# Patient Record
Sex: Male | Born: 1945 | Race: White | Hispanic: No | Marital: Married | Smoking: Never smoker
Health system: Southern US, Community
[De-identification: ages and names within clinical notes are randomized; demographics above are authoritative.]

## PROBLEM LIST (undated history)

## (undated) DIAGNOSIS — Z789 Other specified health status: Secondary | ICD-10-CM

## (undated) HISTORY — PX: HERNIA REPAIR: SHX51

## (undated) HISTORY — PX: APPENDECTOMY: SHX54

## (undated) HISTORY — PX: BACK SURGERY: SHX140

## (undated) HISTORY — PX: KNEE ARTHROSCOPY: SUR90

## (undated) HISTORY — PX: OTHER SURGICAL HISTORY: SHX169

---

## 2013-05-10 ENCOUNTER — Encounter (INDEPENDENT_AMBULATORY_CARE_PROVIDER_SITE_OTHER): Payer: Self-pay | Admitting: *Deleted

## 2013-08-01 ENCOUNTER — Encounter (INDEPENDENT_AMBULATORY_CARE_PROVIDER_SITE_OTHER): Payer: Self-pay | Admitting: *Deleted

## 2013-09-12 ENCOUNTER — Telehealth (INDEPENDENT_AMBULATORY_CARE_PROVIDER_SITE_OTHER): Payer: Self-pay | Admitting: *Deleted

## 2013-09-12 ENCOUNTER — Encounter (INDEPENDENT_AMBULATORY_CARE_PROVIDER_SITE_OTHER): Payer: Self-pay | Admitting: *Deleted

## 2013-09-12 NOTE — Telephone Encounter (Signed)
Letter mailed to patient.

## 2013-09-12 NOTE — Telephone Encounter (Signed)
Inetta Fermo from [facility] called to check on patient's referral. They have a different address and it was corrected. Would like to see if you would please send out another letter to patient to get his TCS scheduled.

## 2013-09-19 ENCOUNTER — Telehealth (INDEPENDENT_AMBULATORY_CARE_PROVIDER_SITE_OTHER): Payer: Self-pay | Admitting: *Deleted

## 2013-09-19 ENCOUNTER — Other Ambulatory Visit (INDEPENDENT_AMBULATORY_CARE_PROVIDER_SITE_OTHER): Payer: Self-pay | Admitting: *Deleted

## 2013-09-19 ENCOUNTER — Encounter (INDEPENDENT_AMBULATORY_CARE_PROVIDER_SITE_OTHER): Payer: Self-pay | Admitting: *Deleted

## 2013-09-19 DIAGNOSIS — Z1211 Encounter for screening for malignant neoplasm of colon: Secondary | ICD-10-CM

## 2013-09-19 NOTE — Telephone Encounter (Signed)
Patient needs movi prep 

## 2013-09-21 MED ORDER — PEG-KCL-NACL-NASULF-NA ASC-C 100 G PO SOLR: 1.0000 | Freq: Once | ORAL | Status: DC

## 2013-11-13 ENCOUNTER — Telehealth (INDEPENDENT_AMBULATORY_CARE_PROVIDER_SITE_OTHER): Payer: Self-pay | Admitting: *Deleted

## 2013-11-13 NOTE — Telephone Encounter (Signed)
agree

## 2013-11-13 NOTE — Telephone Encounter (Signed)
  Procedure: tcs  Reason/Indication:  screening  Has patient had this procedure before?  Yes, 13-14 yrs ago  If so, when, by whom and where?    Is there a family history of colon cancer?  no  Who?  What age when diagnosed?    Is patient diabetic?   no      Does patient have prosthetic heart valve?  no  Do you have a pacemaker?  no  Has patient ever had endocarditis? no  Has patient had joint replacement within last 12 months?  no  Does patient tend to be constipated or take laxatives? no  Is patient on Coumadin, Plavix and/or Aspirin? no  Medications: none  Allergies: nkda  Medication Adjustment:   Procedure date & time: 12/06/13 at 830

## 2013-11-27 ENCOUNTER — Encounter (HOSPITAL_COMMUNITY): Payer: Self-pay | Admitting: Pharmacy Technician

## 2013-12-06 ENCOUNTER — Encounter (HOSPITAL_COMMUNITY)
Admission: RE | Disposition: A | Discharge status: Home / Self Care | Payer: Self-pay | Source: Ambulatory Visit | Attending: Internal Medicine

## 2013-12-06 ENCOUNTER — Ambulatory Visit (HOSPITAL_COMMUNITY)
Admission: RE | Admit: 2013-12-06 | Discharge: 2013-12-06 | Disposition: A | Discharge status: Home / Self Care | Payer: Medicare Other | Source: Ambulatory Visit | Attending: Internal Medicine | Admitting: Internal Medicine

## 2013-12-06 ENCOUNTER — Encounter (HOSPITAL_COMMUNITY): Payer: Self-pay | Admitting: *Deleted

## 2013-12-06 DIAGNOSIS — D126 Benign neoplasm of colon, unspecified: Secondary | ICD-10-CM | POA: Insufficient documentation

## 2013-12-06 DIAGNOSIS — K5730 Diverticulosis of large intestine without perforation or abscess without bleeding: Secondary | ICD-10-CM | POA: Insufficient documentation

## 2013-12-06 DIAGNOSIS — Z1211 Encounter for screening for malignant neoplasm of colon: Secondary | ICD-10-CM | POA: Insufficient documentation

## 2013-12-06 HISTORY — PX: COLONOSCOPY: SHX5424

## 2013-12-06 HISTORY — DX: Other specified health status: Z78.9

## 2013-12-06 SURGERY — COLONOSCOPY
Anesthesia: Moderate Sedation

## 2013-12-06 MED ORDER — SODIUM CHLORIDE 0.9 % IV SOLN
INTRAVENOUS | Status: DC
Start: 2013-12-06 — End: 2013-12-06
  Administered 2013-12-06: 08:00:00 via INTRAVENOUS

## 2013-12-06 MED ORDER — MIDAZOLAM HCL 5 MG/5ML IJ SOLN
INTRAMUSCULAR | Status: DC | PRN
  Administered 2013-12-06: 2 mg via INTRAVENOUS
  Administered 2013-12-06 (×2): 1 mg via INTRAVENOUS
  Administered 2013-12-06: 2 mg via INTRAVENOUS

## 2013-12-06 MED ORDER — MEPERIDINE HCL 50 MG/ML IJ SOLN
INTRAMUSCULAR | Status: DC | PRN
  Administered 2013-12-06 (×2): 25 mg via INTRAVENOUS

## 2013-12-06 MED ORDER — MIDAZOLAM HCL 5 MG/5ML IJ SOLN
INTRAMUSCULAR | Status: AC
  Filled 2013-12-06: qty 10

## 2013-12-06 MED ORDER — STERILE WATER FOR IRRIGATION IR SOLN
Status: DC | PRN
  Administered 2013-12-06: 09:00:00

## 2013-12-06 MED ORDER — MEPERIDINE HCL 50 MG/ML IJ SOLN
INTRAMUSCULAR | Status: DC
Start: 2013-12-06 — End: 2013-12-06
  Filled 2013-12-06: qty 1

## 2013-12-06 NOTE — Discharge Instructions (Addendum)
No aspirin or NSAIDs for 1 week. High fiber diet. No driving for 24 hours. Physician will contact you with biopsy results.  Colonoscopy Care After Read the instructions outlined below and refer to this sheet in the next few weeks. These discharge instructions provide you with general information on caring for yourself after you leave the hospital. Your doctor may also give you specific instructions. While your treatment has been planned according to the most current medical practices available, unavoidable complications occasionally occur. If you have any problems or questions after discharge, call your doctor. HOME CARE INSTRUCTIONS ACTIVITY:  You may resume your regular activity, but move at a slower pace for the next 24 hours.  Take frequent rest periods for the next 24 hours.  Walking will help get rid of the air and reduce the bloated feeling in your belly (abdomen).  No driving for 24 hours (because of the medicine (anesthesia) used during the test).  You may shower.  Do not sign any important legal documents or operate any machinery for 24 hours (because of the anesthesia used during the test). NUTRITION:  Drink plenty of fluids.  You may resume your normal diet as instructed by your doctor.  Begin with a light meal and progress to your normal diet. Heavy or fried foods are harder to digest and may make you feel sick to your stomach (nauseated).  Avoid alcoholic beverages for 24 hours or as instructed. MEDICATIONS:  You may resume your normal medications unless your doctor tells you otherwise. WHAT TO EXPECT TODAY:  Some feelings of bloating in the abdomen.  Passage of more gas than usual.  Spotting of blood in your stool or on the toilet paper. IF YOU HAD POLYPS REMOVED DURING THE COLONOSCOPY:  No aspirin products for 7 days or as instructed.  No alcohol for 7 days or as instructed.  Eat a soft diet for the next 24 hours. FINDING OUT THE RESULTS OF YOUR  TEST Not all test results are available during your visit. If your test results are not back during the visit, make an appointment with your caregiver to find out the results. Do not assume everything is normal if you have not heard from your caregiver or the medical facility. It is important for you to follow up on all of your test results.  SEEK IMMEDIATE MEDICAL CARE IF:  You have more than a spotting of blood in your stool.  Your belly is swollen (abdominal distention).  You are nauseated or vomiting.  You have a fever.  You have abdominal pain or discomfort that is severe or gets worse throughout the day. Document Released: 06/29/2004 Document Revised: 02/07/2012 Document Reviewed: 06/27/2008 [facility] Patient Information 2014 [place]. Colon Polyps Polyps are lumps of extra tissue growing inside the body. Polyps can grow in the large intestine (colon). Most colon polyps are noncancerous (benign). However, some colon polyps can become cancerous over time. Polyps that are larger than a pea may be harmful. To be safe, caregivers remove and test all polyps. CAUSES  Polyps form when mutations in the genes cause your cells to grow and divide even though no more tissue is needed. RISK FACTORS There are a number of risk factors that can increase your chances of getting colon polyps. They include:  Being older than 50 years.  Family history of colon polyps or colon cancer.  Long-term colon diseases, such as colitis or Crohn disease.  Being overweight.  Smoking.  Being inactive.  Drinking too much alcohol.  SYMPTOMS  Most small polyps do not cause symptoms. If symptoms are present, they may include:  Blood in the stool. The stool may look dark red or black.  Constipation or diarrhea that lasts longer than 1 week. DIAGNOSIS People often do not know they have polyps until their caregiver finds them during a regular checkup. Your caregiver can use 4 tests to check for  polyps:  Digital rectal exam. The caregiver wears gloves and feels inside the rectum. This test would find polyps only in the rectum.  Barium enema. The caregiver puts a liquid called barium into your rectum before taking X-rays of your colon. Barium makes your colon look white. Polyps are dark, so they are easy to see in the X-ray pictures.  Sigmoidoscopy. A thin, flexible tube (sigmoidoscope) is placed into your rectum. The sigmoidoscope has a light and tiny camera in it. The caregiver uses the sigmoidoscope to look at the last third of your colon.  Colonoscopy. This test is like sigmoidoscopy, but the caregiver looks at the entire colon. This is the most common method for finding and removing polyps. TREATMENT  Any polyps will be removed during a sigmoidoscopy or colonoscopy. The polyps are then tested for cancer. PREVENTION  To help lower your risk of getting more colon polyps:  Eat plenty of fruits and vegetables. Avoid eating fatty foods.  Do not smoke.  Avoid drinking alcohol.  Exercise every day.  Lose weight if recommended by your caregiver.  Eat plenty of calcium and folate. Foods that are rich in calcium include milk, cheese, and broccoli. Foods that are rich in folate include chickpeas, kidney beans, and spinach. HOME CARE INSTRUCTIONS Keep all follow-up appointments as directed by your caregiver. You may need periodic exams to check for polyps. SEEK MEDICAL CARE IF: You notice bleeding during a bowel movement. Document Released: 08/11/2004 Document Revised: 02/07/2012 Document Reviewed: 01/25/2012 [facility] Patient Information 2014 [place]. High-Fiber Diet Fiber is found in fruits, vegetables, and grains. A high-fiber diet encourages the addition of more whole grains, legumes, fruits, and vegetables in your diet. The recommended amount of fiber for adult males is 38 g per day. For adult females, it is 25 g per day. Pregnant and lactating women should get 28 g of  fiber per day. If you have a digestive or bowel problem, ask your caregiver for advice before adding high-fiber foods to your diet. Eat a variety of high-fiber foods instead of only a select few type of foods.  PURPOSE  To increase stool bulk.  To make bowel movements more regular to prevent constipation.  To lower cholesterol.  To prevent overeating. WHEN IS THIS DIET USED?  It may be used if you have constipation and hemorrhoids.  It may be used if you have uncomplicated diverticulosis (intestine condition) and irritable bowel syndrome.  It may be used if you need help with weight management.  It may be used if you want to add it to your diet as a protective measure against atherosclerosis, diabetes, and cancer. SOURCES OF FIBER  Whole-grain breads and cereals.  Fruits, such as apples, oranges, bananas, berries, prunes, and pears.  Vegetables, such as green peas, carrots, sweet potatoes, beets, broccoli, cabbage, spinach, and artichokes.  Legumes, such split peas, soy, lentils.  Almonds. FIBER CONTENT IN FOODS Starches and Grains / Dietary Fiber (g)  Cheerios, 1 cup / 3 g  Corn Flakes cereal, 1 cup / 0.7 g  Rice crispy treat cereal, 1 cup / 0.3 g  Instant  oatmeal (cooked),  cup / 2 g  Frosted wheat cereal, 1 cup / 5.1 g  Brown, long-grain rice (cooked), 1 cup / 3.5 g  White, long-grain rice (cooked), 1 cup / 0.6 g  Enriched macaroni (cooked), 1 cup / 2.5 g Legumes / Dietary Fiber (g)  Baked beans (canned, plain, or vegetarian),  cup / 5.2 g  Kidney beans (canned),  cup / 6.8 g  Pinto beans (cooked),  cup / 5.5 g Breads and Crackers / Dietary Fiber (g)  Plain or honey graham crackers, 2 squares / 0.7 g  Saltine crackers, 3 squares / 0.3 g  Plain, salted pretzels, 10 pieces / 1.8 g  Whole-wheat bread, 1 slice / 1.9 g  White bread, 1 slice / 0.7 g  Raisin bread, 1 slice / 1.2 g  Plain bagel, 3 oz / 2 g  Flour tortilla, 1 oz / 0.9 g  Corn  tortilla, 1 small / 1.5 g  Hamburger or hotdog bun, 1 small / 0.9 g Fruits / Dietary Fiber (g)  Apple with skin, 1 medium / 4.4 g  Sweetened applesauce,  cup / 1.5 g  Banana,  medium / 1.5 g  Grapes, 10 grapes / 0.4 g  Orange, 1 small / 2.3 g  Raisin, 1.5 oz / 1.6 g  Melon, 1 cup / 1.4 g Vegetables / Dietary Fiber (g)  Green beans (canned),  cup / 1.3 g  Carrots (cooked),  cup / 2.3 g  Broccoli (cooked),  cup / 2.8 g  Peas (cooked),  cup / 4.4 g  Mashed potatoes,  cup / 1.6 g  Lettuce, 1 cup / 0.5 g  Corn (canned),  cup / 1.6 g  Tomato,  cup / 1.1 g Document Released: 11/15/2005 Document Revised: 05/16/2012 Document Reviewed: 02/17/2012 [facility] Patient Information 2014 [place].

## 2013-12-06 NOTE — Op Note (Signed)
COLONOSCOPY PROCEDURE REPORT  PATIENT:  Erik Ware  MR#:  [id number] Birthdate:  1946/08/04, 68 y.o., male Endoscopist:  Dr. Rogene Houston, MD Referred By:  Dr. Monico Blitz, MD Procedure Date: 12/06/2013  Procedure:   Colonoscopy with snare polypectomy.  Indications:  Patient is 68 year old Caucasian male who is undergoing average risk screening colonoscopy.  Informed Consent:  The procedure and risks were reviewed with the patient and informed consent was obtained.  Medications:  Demerol 50 mg IV Versed 6 mg IV  Description of procedure:  After a digital rectal exam was performed, that colonoscope was advanced from the anus through the rectum and colon to the area of the cecum, ileocecal valve and appendiceal orifice. The cecum was deeply intubated. These structures were well-seen and photographed for the record. From the level of the cecum and ileocecal valve, the scope was slowly and cautiously withdrawn. The mucosal surfaces were carefully surveyed utilizing scope tip to flexion to facilitate fold flattening as needed. The scope was pulled down into the rectum where a thorough exam including retroflexion was performed.  Findings:  Prep fair to satisfactory satisfactory. 10 mm broad-based polyp snared from cecum. Polyp broke into pieces and was retrieved. 2 small polyps over ileocecal valve were coagulated using snare tip. 2 small polyps was snared from hepatic flexure and submitted together(1 was cold snared and the other one was hot snared; these were 4 and 6 mm in size). Scattered diverticula at sigmoid colon. Normal rectal mucosa and anal rectal junction   Therapeutic/Diagnostic Maneuvers Performed:  See above  Complications:  None  Cecal Withdrawal Time:  23 minutes  Impression:  Examination performed to cecum. 10 mm broad-based polyp snared from cecum. Two small polyps over ileocecal valve were coagulated using snare tip. Two small polyps were snared from hepatic  flexure as above and submitted together. Mild sigmoid colon diverticulosis.  Recommendations:  Standard instructions given. I will contact patient with biopsy results and further recommendations.  REHMAN,NAJEEB U  12/06/2013 9:33 AM  CC: Dr. Monico Blitz, MD & Dr. Rayne Du ref. provider found

## 2013-12-06 NOTE — H&P (Signed)
Erik Ware is an 68 y.o. male.   Chief Complaint: Patient is here for colonoscopy. HPI: Patient is 68 year old Caucasian male who is here for screening colonoscopy. He denies abdominal pain rectal bleeding or change in his bowel habits. Family history is negative for CRC. Last colonoscopy was over 13 years ago.  Past Medical History  Diagnosis Date  . Medical history non-contributory     Past Surgical History  Procedure Laterality Date  . Appendectomy    . Back surgery    . Left hip pinning    . Hernia repair    . Knee arthroscopy Right     Family History  Problem Relation Age of Onset  . Colon cancer Neg Hx    Social History:  reports that he has never smoked. He does not have any smokeless tobacco history on file. He reports that he drinks about 1.0 ounces of alcohol per week. He reports that he does not use illicit drugs.  Allergies: No Known Allergies  Medications Prior to Admission  Medication Sig Dispense Refill  . peg 3350 powder (MOVIPREP) 100 G SOLR Take 1 kit (200 g total) by mouth once.  1 kit  0    No results found for this or any previous visit (from the past 48 hour(s)). No results found.  ROS  Blood pressure 152/83, pulse 71, temperature 98.2 F (36.8 C), temperature source Oral, resp. rate 20, height '6\' 2"'  (1.88 m), weight 180 lb (81.647 kg), SpO2 95.00%. Physical Exam  Constitutional: He appears well-developed and well-nourished.  HENT:  Mouth/Throat: Oropharynx is clear and moist.  Eyes: Conjunctivae are normal. No scleral icterus.  Neck: No thyromegaly present.  Cardiovascular: Normal rate, regular rhythm and normal heart sounds.   No murmur heard. Respiratory: Effort normal and breath sounds normal.  GI: Soft. He exhibits no distension and no mass. There is no tenderness.  Musculoskeletal: He exhibits no edema.  Lymphadenopathy:    He has no cervical adenopathy.  Neurological: He is alert.  Skin: Skin is warm and dry.      Assessment/Plan Average risk screening colonoscopy.  Maurica Omura U 12/06/2013, 8:38 AM

## 2013-12-10 ENCOUNTER — Encounter (HOSPITAL_COMMUNITY): Payer: Self-pay | Admitting: Internal Medicine

## 2013-12-24 ENCOUNTER — Encounter (INDEPENDENT_AMBULATORY_CARE_PROVIDER_SITE_OTHER): Payer: Self-pay | Admitting: *Deleted

## 2016-05-06 ENCOUNTER — Other Ambulatory Visit (HOSPITAL_COMMUNITY): Payer: Self-pay | Admitting: Urology

## 2016-05-06 DIAGNOSIS — Z8042 Family history of malignant neoplasm of prostate: Secondary | ICD-10-CM

## 2016-05-06 DIAGNOSIS — R972 Elevated prostate specific antigen [PSA]: Secondary | ICD-10-CM

## 2016-05-21 ENCOUNTER — Ambulatory Visit (HOSPITAL_COMMUNITY): Admission: RE | Admit: 2016-05-21 | Payer: Medicare HMO | Source: Ambulatory Visit

## 2016-06-07 ENCOUNTER — Ambulatory Visit (HOSPITAL_COMMUNITY): Admission: RE | Admit: 2016-06-07 | Payer: Medicare HMO | Source: Ambulatory Visit

## 2016-07-19 ENCOUNTER — Ambulatory Visit (HOSPITAL_COMMUNITY)
Admission: RE | Admit: 2016-07-19 | Discharge: 2016-07-19 | Disposition: A | Discharge status: Home / Self Care | Payer: Medicare HMO | Source: Ambulatory Visit | Attending: Urology | Admitting: Urology

## 2016-07-19 DIAGNOSIS — Z8042 Family history of malignant neoplasm of prostate: Secondary | ICD-10-CM

## 2016-07-19 DIAGNOSIS — R972 Elevated prostate specific antigen [PSA]: Secondary | ICD-10-CM

## 2016-07-19 DIAGNOSIS — N4289 Other specified disorders of prostate: Secondary | ICD-10-CM | POA: Diagnosis not present

## 2016-07-19 LAB — POCT I-STAT CREATININE: CREATININE: 1.1 mg/dL (ref 0.61–1.24)

## 2016-07-19 MED ORDER — GADOBENATE DIMEGLUMINE 529 MG/ML IV SOLN
17.0000 mL | Freq: Once | INTRAVENOUS | Status: AC | PRN
  Administered 2016-07-19: 17 mL via INTRAVENOUS

## 2016-12-03 ENCOUNTER — Encounter (INDEPENDENT_AMBULATORY_CARE_PROVIDER_SITE_OTHER): Payer: Self-pay | Admitting: *Deleted

## 2017-01-27 ENCOUNTER — Other Ambulatory Visit (INDEPENDENT_AMBULATORY_CARE_PROVIDER_SITE_OTHER): Payer: Self-pay | Admitting: *Deleted

## 2017-01-27 DIAGNOSIS — Z860100 Personal history of colon polyps, unspecified: Secondary | ICD-10-CM | POA: Insufficient documentation

## 2017-03-04 ENCOUNTER — Telehealth (INDEPENDENT_AMBULATORY_CARE_PROVIDER_SITE_OTHER): Payer: Self-pay | Admitting: *Deleted

## 2017-03-04 ENCOUNTER — Encounter (INDEPENDENT_AMBULATORY_CARE_PROVIDER_SITE_OTHER): Payer: Self-pay | Admitting: *Deleted

## 2017-03-04 MED ORDER — PEG 3350-KCL-NA BICARB-NACL 420 G PO SOLR
4000.0000 mL | Freq: Once | ORAL | 0 refills | Status: AC
Start: 2017-03-04 — End: 2017-03-04

## 2017-03-04 NOTE — Telephone Encounter (Signed)
Patient needs trilyte 

## 2017-03-23 ENCOUNTER — Telehealth (INDEPENDENT_AMBULATORY_CARE_PROVIDER_SITE_OTHER): Payer: Self-pay | Admitting: *Deleted

## 2017-03-23 NOTE — Telephone Encounter (Signed)
agree

## 2017-03-23 NOTE — Telephone Encounter (Signed)
Referring MD/PCP: shah   Procedure: tcs  Reason/Indication:  Hx polyps  Has patient had this procedure before?  Yes, 2015  If so, when, by whom and where?    Is there a family history of colon cancer?  no  Who?  What age when diagnosed?    Is patient diabetic?   no      Does patient have prosthetic heart valve or mechanical valve?  no  Do you have a pacemaker?  no  Has patient ever had endocarditis? no  Has patient had joint replacement within last 12 months?  no  Does patient tend to be constipated or take laxatives? no  Does patient have a history of alcohol/drug use?  no  Is patient on Coumadin, Plavix and/or Aspirin? no  Medications: none  Allergies: nkda  Medication Adjustment per Dr Laural Golden:   Procedure date & time: 04/21/17

## 2017-03-29 ENCOUNTER — Telehealth (INDEPENDENT_AMBULATORY_CARE_PROVIDER_SITE_OTHER): Payer: Self-pay | Admitting: *Deleted

## 2017-03-29 MED ORDER — PEG 3350-KCL-NA BICARB-NACL 420 G PO SOLR: 4000.0000 mL | Freq: Once | ORAL | 0 refills | Status: AC

## 2017-03-29 NOTE — Telephone Encounter (Signed)
Patient needs trilyte 

## 2017-04-06 DIAGNOSIS — H353132 Nonexudative age-related macular degeneration, bilateral, intermediate dry stage: Secondary | ICD-10-CM | POA: Diagnosis not present

## 2017-04-06 DIAGNOSIS — H353211 Exudative age-related macular degeneration, right eye, with active choroidal neovascularization: Secondary | ICD-10-CM | POA: Diagnosis not present

## 2017-04-06 DIAGNOSIS — H353221 Exudative age-related macular degeneration, left eye, with active choroidal neovascularization: Secondary | ICD-10-CM | POA: Diagnosis not present

## 2017-04-21 ENCOUNTER — Encounter (HOSPITAL_COMMUNITY): Payer: Self-pay | Admitting: *Deleted

## 2017-04-21 ENCOUNTER — Ambulatory Visit (HOSPITAL_COMMUNITY)
Admission: RE | Admit: 2017-04-21 | Discharge: 2017-04-21 | Disposition: A | Discharge status: Home / Self Care | Payer: Medicare Other | Source: Ambulatory Visit | Attending: Internal Medicine | Admitting: Internal Medicine

## 2017-04-21 ENCOUNTER — Encounter (HOSPITAL_COMMUNITY)
Admission: RE | Disposition: A | Discharge status: Home / Self Care | Payer: Self-pay | Source: Ambulatory Visit | Attending: Internal Medicine

## 2017-04-21 DIAGNOSIS — Z1211 Encounter for screening for malignant neoplasm of colon: Secondary | ICD-10-CM | POA: Diagnosis not present

## 2017-04-21 DIAGNOSIS — Z86010 Personal history of colonic polyps: Secondary | ICD-10-CM | POA: Diagnosis not present

## 2017-04-21 DIAGNOSIS — Z808 Family history of malignant neoplasm of other organs or systems: Secondary | ICD-10-CM | POA: Diagnosis not present

## 2017-04-21 DIAGNOSIS — K644 Residual hemorrhoidal skin tags: Secondary | ICD-10-CM | POA: Insufficient documentation

## 2017-04-21 DIAGNOSIS — K6289 Other specified diseases of anus and rectum: Secondary | ICD-10-CM | POA: Insufficient documentation

## 2017-04-21 DIAGNOSIS — D120 Benign neoplasm of cecum: Secondary | ICD-10-CM | POA: Insufficient documentation

## 2017-04-21 DIAGNOSIS — K5730 Diverticulosis of large intestine without perforation or abscess without bleeding: Secondary | ICD-10-CM | POA: Insufficient documentation

## 2017-04-21 DIAGNOSIS — Z8042 Family history of malignant neoplasm of prostate: Secondary | ICD-10-CM | POA: Insufficient documentation

## 2017-04-21 DIAGNOSIS — Z803 Family history of malignant neoplasm of breast: Secondary | ICD-10-CM | POA: Diagnosis not present

## 2017-04-21 DIAGNOSIS — Z09 Encounter for follow-up examination after completed treatment for conditions other than malignant neoplasm: Secondary | ICD-10-CM | POA: Diagnosis not present

## 2017-04-21 HISTORY — PX: COLONOSCOPY: SHX5424

## 2017-04-21 HISTORY — PX: POLYPECTOMY: SHX5525

## 2017-04-21 SURGERY — COLONOSCOPY
Anesthesia: Moderate Sedation

## 2017-04-21 MED ORDER — MEPERIDINE HCL 50 MG/ML IJ SOLN
INTRAMUSCULAR | Status: AC
  Filled 2017-04-21: qty 1

## 2017-04-21 MED ORDER — MIDAZOLAM HCL 5 MG/5ML IJ SOLN
INTRAMUSCULAR | Status: AC
  Filled 2017-04-21: qty 10

## 2017-04-21 MED ORDER — MEPERIDINE HCL 50 MG/ML IJ SOLN
INTRAMUSCULAR | Status: DC | PRN
  Administered 2017-04-21 (×2): 25 mg via INTRAVENOUS

## 2017-04-21 MED ORDER — SODIUM CHLORIDE 0.9 % IV SOLN
INTRAVENOUS | Status: DC
  Administered 2017-04-21: 11:00:00 via INTRAVENOUS

## 2017-04-21 MED ORDER — MIDAZOLAM HCL 5 MG/5ML IJ SOLN
INTRAMUSCULAR | Status: DC | PRN
  Administered 2017-04-21: 1 mg via INTRAVENOUS
  Administered 2017-04-21 (×2): 2 mg via INTRAVENOUS

## 2017-04-21 NOTE — H&P (Signed)
Erik Ware is an 71 y.o. male.   Chief Complaint: Patient is here for colonoscopy. HPI: Patient is 71 year old Caucasian male with history of colonic adenomas and is here for surveillance colonoscopy. Last exam was over 3 years ago with removal of cecal tubulovillous adenoma and 3 more tubular adenomas. He denies abdominal pain change in bowel habits or rectal bleeding. Family history is negative for CRC.  Past Medical History:  Diagnosis Date  . Medical history non-contributory        History of colonic polyps.  Past Surgical History:  Procedure Laterality Date  . APPENDECTOMY    . BACK SURGERY    . COLONOSCOPY N/A 12/06/2013   Procedure: COLONOSCOPY;  Surgeon: Rogene Houston, MD;  Location: [facility];  Service: Endoscopy;  Laterality: N/A;  830  . HERNIA REPAIR    . KNEE ARTHROSCOPY Right   . Left hip pinning      Family History  Problem Relation Age of Onset  . Thyroid cancer Mother   . Breast cancer Mother   . Prostate cancer Father   . Colon cancer Neg Hx    Social History:  reports that he has never smoked. He has never used smokeless tobacco. He reports that he drinks about 1.0 oz of alcohol per week . He reports that he does not use drugs.  Allergies: No Known Allergies  Medications Prior to Admission  Medication Sig Dispense Refill  . Multiple Vitamins-Minerals (PRESERVISION AREDS 2 PO) Take 1 capsule by mouth 2 (two) times daily.      No results found for this or any previous visit (from the past 48 hour(s)). No results found.  ROS  Blood pressure 137/74, pulse 69, temperature 98.1 F (36.7 C), temperature source Oral, resp. rate 15, height 6\' 2"  (1.88 m), weight 170 lb (77.1 kg), SpO2 98 %. Physical Exam  Constitutional:  Well-developed thin Caucasian male in NAD.  HENT:  Mouth/Throat: Oropharynx is clear and moist.  Eyes: Conjunctivae are normal. No scleral icterus.  Neck: No thyromegaly present.  Cardiovascular: Normal rate, regular rhythm and  normal heart sounds.   No murmur heard. Respiratory: Effort normal and breath sounds normal.  GI:  Abdomen is flat soft and nontender without organomegaly  or masses  Musculoskeletal: He exhibits no edema.  Lymphadenopathy:    He has no cervical adenopathy.  Neurological: He is alert.  Skin: Skin is warm and dry.     Assessment/Plan History of colonic adenomas. Surveillance colonoscopy.  [name], MD 04/21/2017, 11:40 AM

## 2017-04-21 NOTE — Discharge Instructions (Signed)
° °  Colon Polyps Polyps are tissue growths inside the body. Polyps can grow in many places, including the large intestine (colon). A polyp may be a round bump or a mushroom-shaped growth. You could have one polyp or several. Most colon polyps are noncancerous (benign). However, some colon polyps can become cancerous over time. What are the causes? The exact cause of colon polyps is not known. What increases the risk? This condition is more likely to develop in people who:  Have a family history of colon cancer or colon polyps.  Are older than 82 or older than 45 if they are African American.  Have inflammatory bowel disease, such as ulcerative colitis or Crohn disease.  Are overweight.  Smoke cigarettes.  Do not get enough exercise.  Drink too much alcohol.  Eat a diet that is:  High in fat and red meat.  Low in fiber.  Had childhood cancer that was treated with abdominal radiation. What are the signs or symptoms? Most polyps do not cause symptoms. If you have symptoms, they may include:  Blood coming from your rectum when having a bowel movement.  Blood in your stool.The stool may look dark red or black.  A change in bowel habits, such as constipation or diarrhea. How is this diagnosed? This condition is diagnosed with a colonoscopy. This is a procedure that uses a lighted, flexible scope to look at the inside of your colon. How is this treated? Treatment for this condition involves removing any polyps that are found. Those polyps will then be tested for cancer. If cancer is found, your health care provider will talk to you about options for colon cancer treatment. Follow these instructions at home: Diet   Eat plenty of fiber, such as fruits, vegetables, and whole grains.  Eat foods that are high in calcium and vitamin D, such as milk, cheese, yogurt, eggs, liver, fish, and broccoli.  Limit foods high in fat, red meats, and processed meats, such as hot dogs, sausage,  bacon, and lunch meats.  Maintain a healthy weight, or lose weight if recommended by your health care provider. General instructions   Do not smoke cigarettes.  Do not drink alcohol excessively.  Keep all follow-up visits as told by your health care provider. This is important. This includes keeping regularly scheduled colonoscopies. Talk to your health care provider about when you need a colonoscopy.  Exercise every day or as told by your health care provider. Contact a health care provider if:  You have new or worsening bleeding during a bowel movement.  You have new or increased blood in your stool.  You have a change in bowel habits.  You unexpectedly lose weight. This information is not intended to replace advice given to you by your health care provider. Make sure you discuss any questions you have with your health care provider. Document Released: 08/11/2004 Document Revised: 04/22/2016 Document Reviewed: 10/06/2015 Elsevier Interactive Patient Education  2017 [place] usual medications and diet. No driving for 24 hours. Physician will call with biopsy results.

## 2017-04-21 NOTE — Op Note (Signed)
[facility] Patient Name: Erik Ware Procedure Date: 04/21/2017 11:37 AM MRN: [id number] Date of Birth: 08/12/1946 Attending MD: [name] , MD CSN: [id number] Age: 71 Admit Type: Outpatient Procedure:                Colonoscopy Indications:              High risk colon cancer surveillance: Personal                            history of colonic polyps Providers:                [name], MD, Lurline Del, RN, Rosina Lowenstein, RN Referring MD:             Monico Blitz, MD Medicines:                Meperidine 50 mg IV, Midazolam 5 mg IV Complications:            No immediate complications. Estimated Blood Loss:     Estimated blood loss was minimal. Procedure:                Pre-Anesthesia Assessment:                           - Prior to the procedure, a History and Physical                            was performed, and patient medications and                            allergies were reviewed. The patient's tolerance of                            previous anesthesia was also reviewed. The risks                            and benefits of the procedure and the sedation                            options and risks were discussed with the patient.                            All questions were answered, and informed consent                            was obtained. Prior Anticoagulants: The patient has                            taken no previous anticoagulant or antiplatelet                            agents. ASA Grade Assessment: I - A normal, healthy                            patient. After reviewing the risks and benefits,  the patient was deemed in satisfactory condition to                            undergo the procedure.                           After obtaining informed consent, the colonoscope                            was passed under direct vision. Throughout the                            procedure, the patient's blood pressure, pulse, and                            oxygen saturations were monitored continuously. The                            EC-[id number] ([id number]) scope was introduced through                            the anus and advanced to the the cecum, identified                            by appendiceal orifice and ileocecal valve. The                            colonoscopy was performed without difficulty. The                            patient tolerated the procedure well. The quality                            of the bowel preparation was excellent. The                            ileocecal valve, appendiceal orifice, and rectum                            were photographed. Scope In: 11:50:32 AM Scope Out: 12:13:29 PM Scope Withdrawal Time: 0 hours 16 minutes 28 seconds  Total Procedure Duration: 0 hours 22 minutes 57 seconds  Findings:      The perianal and digital rectal examinations were normal.      Three sessile polyps were found in the cecum. The polyps were diminutive       in size. These were biopsied with a cold forceps for histology. The       pathology specimen was placed into Bottle Number 1.      Scattered medium-mouthed diverticula were found in the sigmoid colon.      External hemorrhoids were found during retroflexion. The hemorrhoids       were small.      Anal papilla(e) were hypertrophied. Impression:               - Three diminutive polyps in the cecum. Biopsied.                           -  Diverticulosis in the sigmoid colon.                           - External hemorrhoids.                           - Anal papilla(e) were hypertrophied. Moderate Sedation:      Moderate (conscious) sedation was administered by the endoscopy nurse       and supervised by the endoscopist. The following parameters were       monitored: oxygen saturation, heart rate, blood pressure, CO2       capnography and response to care. Total physician intraservice time was       29 minutes. Recommendation:           -  Patient has a contact number available for                            emergencies. The signs and symptoms of potential                            delayed complications were discussed with the                            patient. Return to normal activities tomorrow.                            Written discharge instructions were provided to the                            patient.                           - High fiber diet today.                           - Continue present medications.                           - No aspirin, ibuprofen, naproxen, or other                            non-steroidal anti-inflammatory drugs for 1 day                            after biopsy.                           - Await pathology results.                           - Repeat colonoscopy in 5 years for surveillance. Procedure Code(s):        --- Professional ---                           [phone], Colonoscopy, flexible; with biopsy, single  or multiple                           99152, Moderate sedation services provided by the                            same physician or other qualified health care                            professional performing the diagnostic or                            therapeutic service that the sedation supports,                            requiring the presence of an independent trained                            observer to assist in the monitoring of the                            patient's level of consciousness and physiological                            status; initial 15 minutes of intraservice time,                            patient age 67 years or older                           [phone], Moderate sedation services; each additional                            15 minutes intraservice time Diagnosis Code(s):        --- Professional ---                           Z86.010, Personal history of colonic polyps                           D12.0, Benign neoplasm of  cecum                           K64.4, Residual hemorrhoidal skin tags                           K62.89, Other specified diseases of anus and rectum                           K57.30, Diverticulosis of large intestine without                            perforation or abscess without bleeding CPT copyright 2016 [employer]. All rights reserved. The codes documented in this report are preliminary and upon coder review may  be revised to meet current compliance  requirements. [name], MD [name], MD 04/21/2017 12:20:25 PM This report has been signed electronically. Number of Addenda: 0

## 2017-04-22 DIAGNOSIS — R972 Elevated prostate specific antigen [PSA]: Secondary | ICD-10-CM | POA: Diagnosis not present

## 2017-04-29 ENCOUNTER — Encounter (HOSPITAL_COMMUNITY): Payer: Self-pay | Admitting: Internal Medicine

## 2017-06-05 IMAGING — MR MR PROSTATE WO/W CM
23 of 56 series · 23 of 56 positions shown · IV contrast (yes)
Comparison: None available

CLINICAL DATA: Family history of prostate cancer.  Elevated PSA.

EXAM:
MR PROSTATE WITHOUT AND WITH CONTRAST
TECHNIQUE: Multiplanar multisequence MRI images were obtained of the pelvis
centered about the prostate. Pre and post contrast images were
obtained.
CONTRAST:  17mL MULTIHANCE GADOBENATE DIMEGLUMINE 529 MG/ML IV SOLN

[Series 3: bSSFP fat-sat · axial · 6.0mm · 0.86mm/px · 1 of 44 slices shown]
[im 1/44]
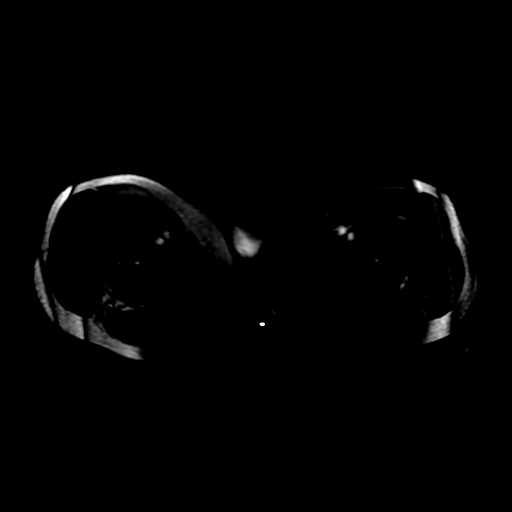

[Series 4: T1 · axial · 6.0mm · 0.86mm/px · 1 of 44 slices shown (1 of 2)]
[im 1/44]
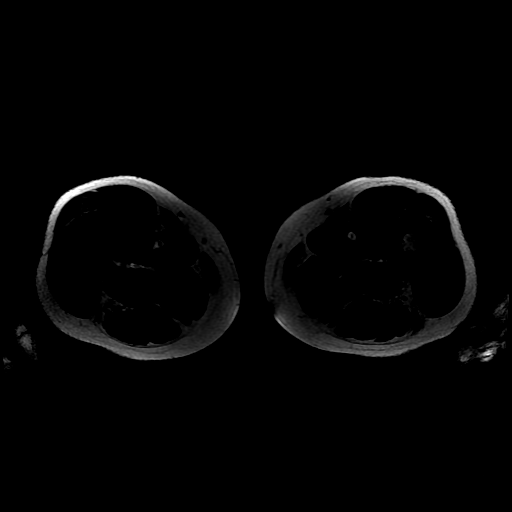

[Series 5: T2 · axial · 3.0mm · 0.29mm/px · 1 of 24 slices shown (1 of 4)]
[im 1/24]
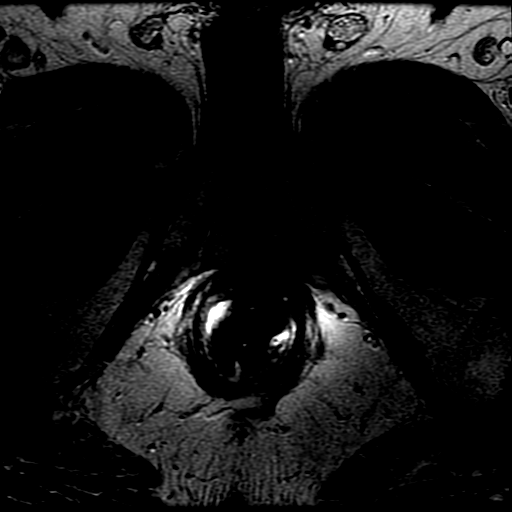

[Series 6: T1 · axial · 3.0mm · 0.29mm/px · 1 of 24 slices shown (2 of 2)]
[im 1/24]
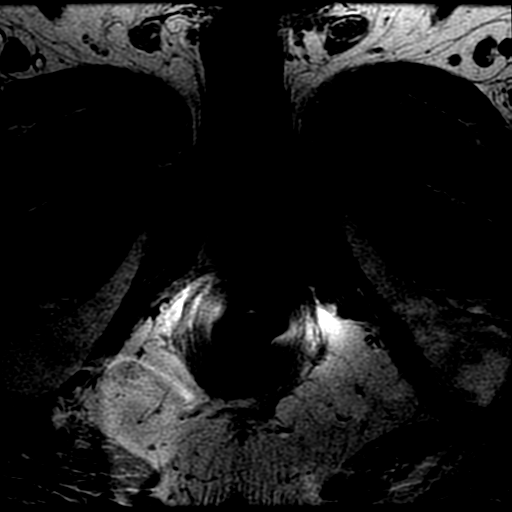

[Series 7: T2 · axial · 1.8mm · 0.47mm/px · 1 of 156 slices shown (2 of 4)]
[im 1/156]
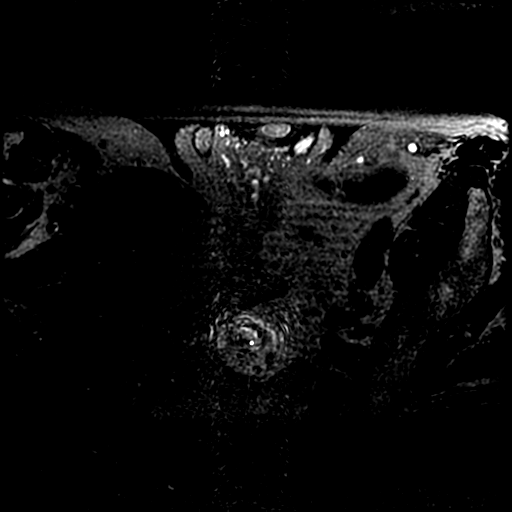

[Series 8: T2 · sagittal · 4.0mm · 0.29mm/px · 1 of 28 slices shown (3 of 4)]
[im 1/28]
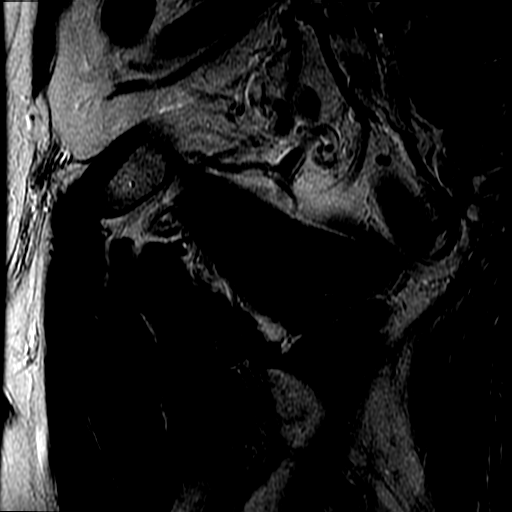

[Series 9: T2 · coronal · 4.0mm · 0.29mm/px · 1 of 22 slices shown (4 of 4)]
[im 1/22]
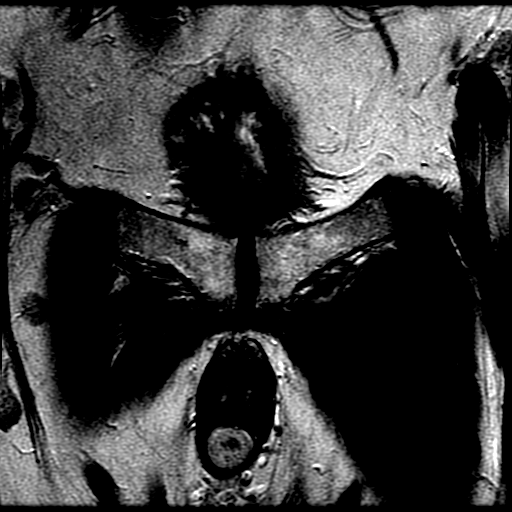

[Series 10: DWI · axial · 3.0mm · 0.59mm/px · 1 of 49 slices shown (1 of 6)]
[im 1/49]
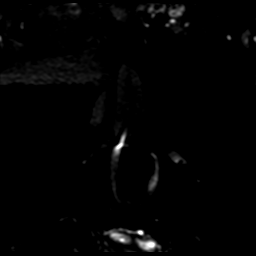

[Series 11: DWI · axial · 3.0mm · 0.59mm/px · 1 of 53 slices shown (2 of 6)]
[im 1/53]
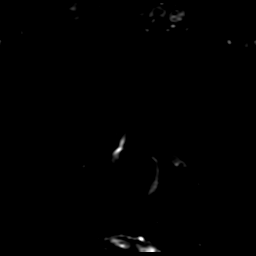

[Series 12: DWI · axial · 3.0mm · 0.59mm/px · 1 of 54 slices shown (3 of 6)]
[im 1/54]
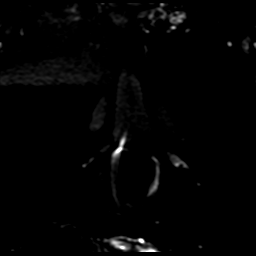

[Series 700: reformatted · axial · 1.8mm · 0.47mm/px · 1 of 59 slices shown (1 of 2)]
[im 1/59]
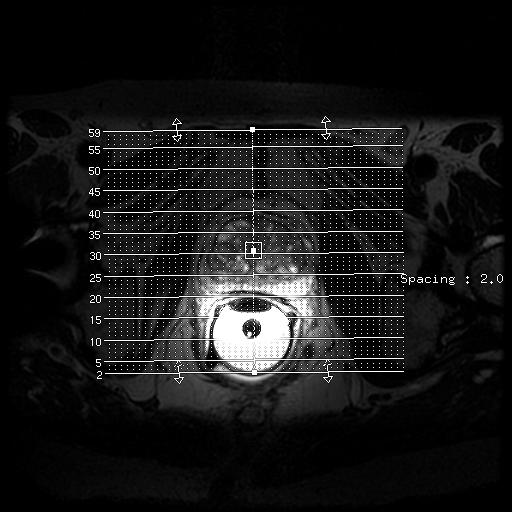

[Series 701: reformatted · axial · 1.8mm · 0.47mm/px · 1 of 56 slices shown (2 of 2)]
[im 1/56]
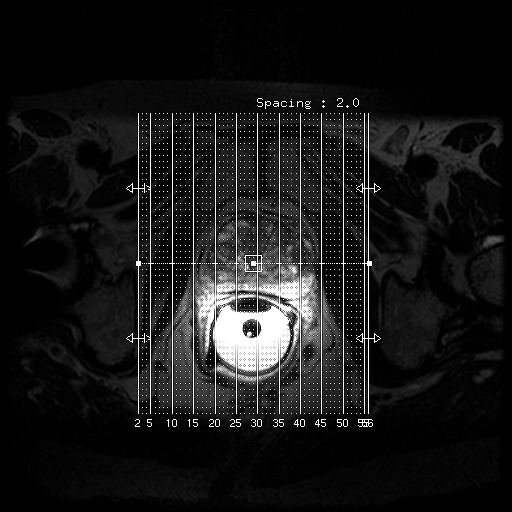

[Series 1000: DWI · axial · 3.0mm · 0.59mm/px · 1 of 28 slices shown (4 of 6)]
[im 1/28]
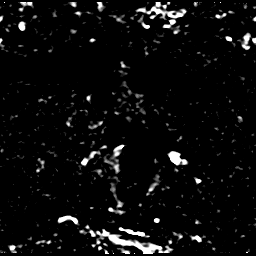

[Series 1100: DWI · axial · 3.0mm · 0.59mm/px · 1 of 28 slices shown (5 of 6)]
[im 1/28]
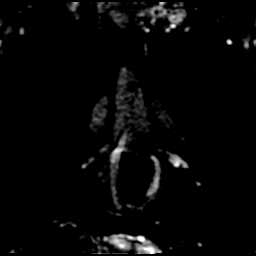

[Series 1200: DWI · axial · 3.0mm · 0.59mm/px · 1 of 28 slices shown (6 of 6)]
[im 1/28]
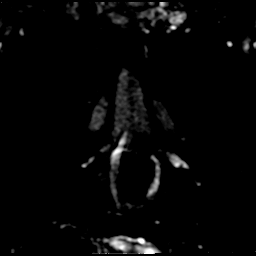

[((id)/(id)/1)-((id)/(id)/1) · axial · 3.0mm · 0.43mm/px · 1 of 59 slices shown (1 of 8)]
[im 1/59]
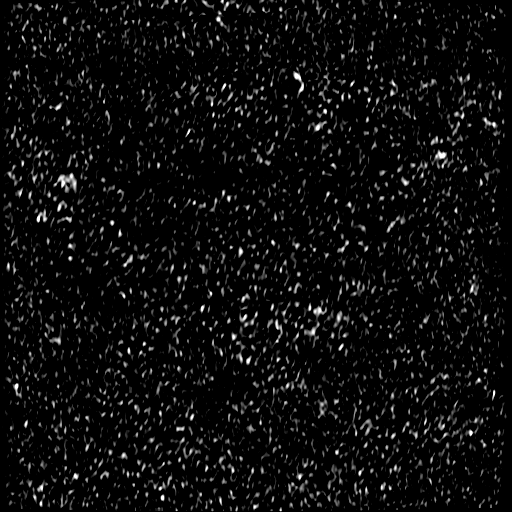

[((id)/(id)/1)-((id)/(id)/1) · axial · 3.0mm · 0.43mm/px · 1 of 67 slices shown (2 of 8)]
[im 1/67]
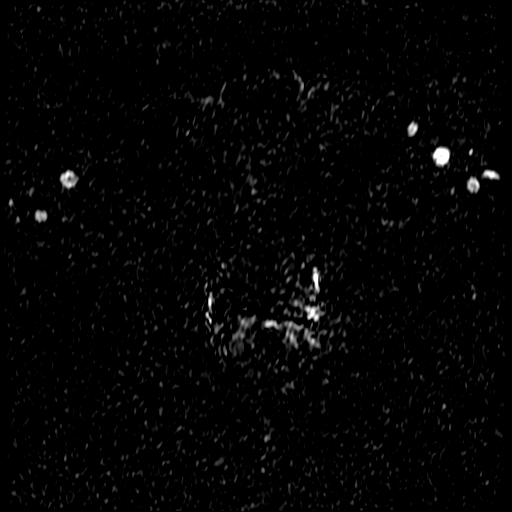

[((id)/(id)/1)-((id)/(id)/1) · axial · 3.0mm · 0.43mm/px · 1 of 65 slices shown (3 of 8)]
[im 1/65]
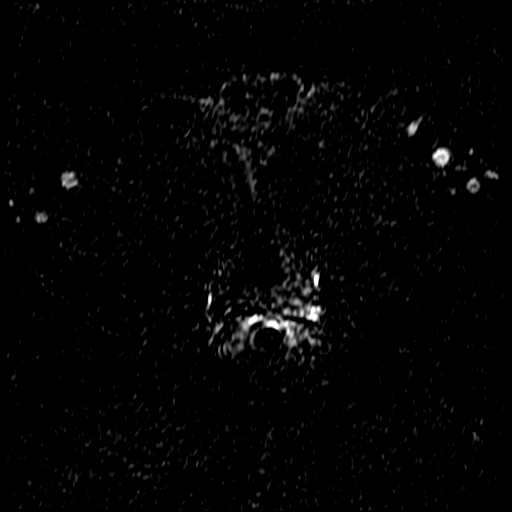

[((id)/(id)/1)-((id)/(id)/1) · axial · 3.0mm · 0.43mm/px · 1 of 69 slices shown (4 of 8)]
[im 1/69]
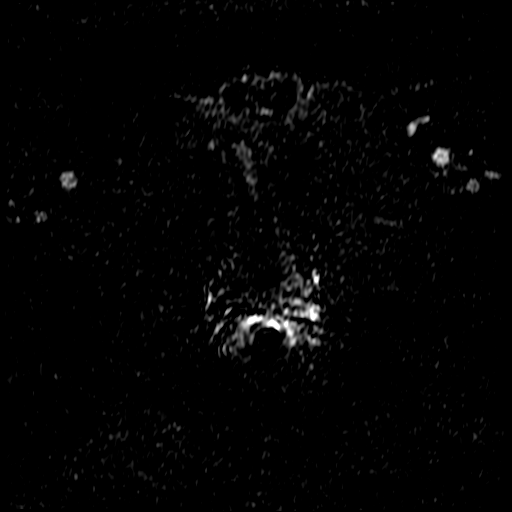

[((id)/(id)/1)-((id)/(id)/1) · axial · 3.0mm · 0.43mm/px · 1 of 70 slices shown (5 of 8)]
[im 1/70]
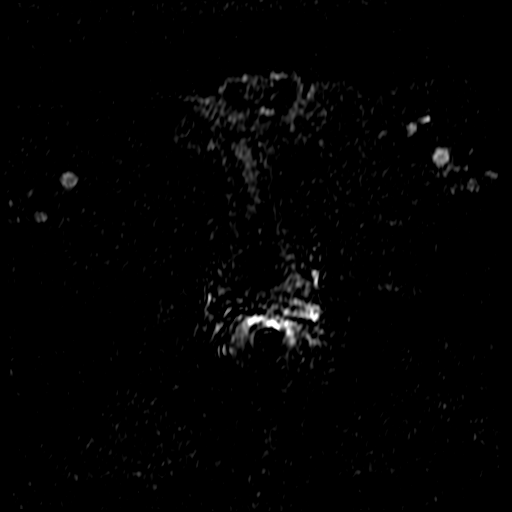

[((id)/(id)/1)-((id)/(id)/1) · axial · 3.0mm · 0.43mm/px · 1 of 70 slices shown (6 of 8)]
[im 1/70]
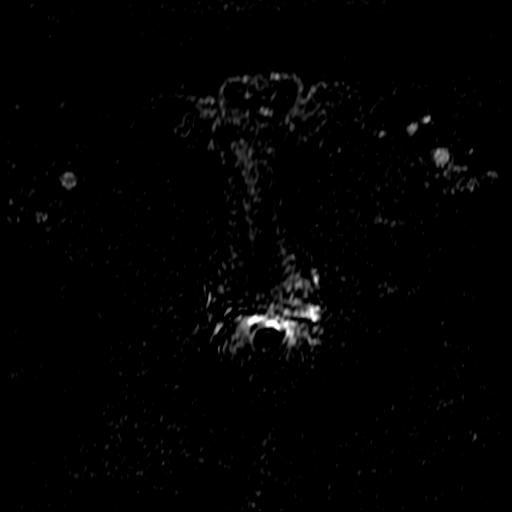

[((id)/(id)/1)-((id)/(id)/1) · axial · 3.0mm · 0.43mm/px · 1 of 70 slices shown (7 of 8)]
[im 1/70]
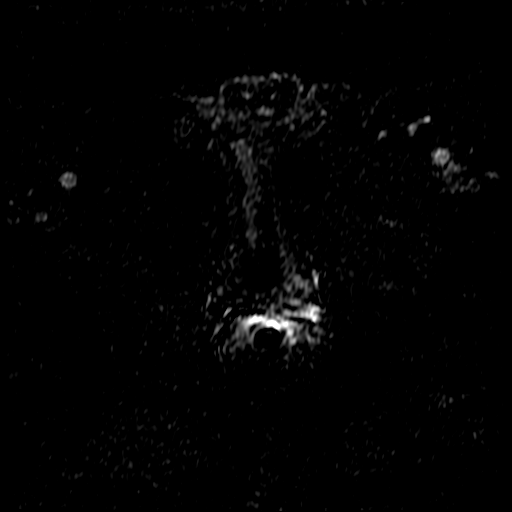

[((id)/(id)/1)-((id)/(id)/1) · axial · 3.0mm · 0.43mm/px · 1 of 69 slices shown (8 of 8)]
[im 1/69]
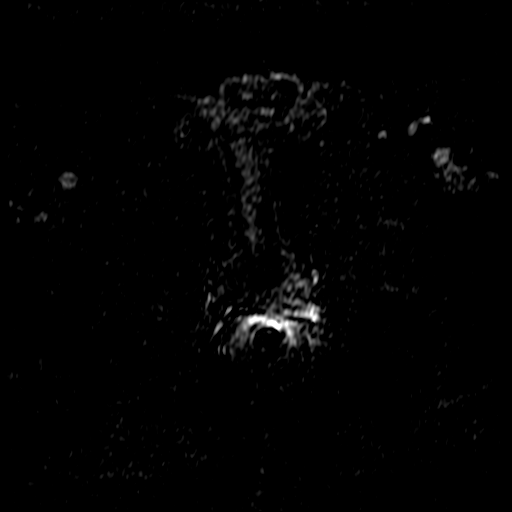

[23 of 56 positions shown; findings below may reference images not displayed]

FINDINGS: Some artifact from RIGHT hip prosthetic most noticeable on the
[place] series.

Prostate: Mild heterogeneity of signal within the peripheral zone on
T2 weighted imaging. On the diffusion-weighted imaging (series 2000)
there is a rounded focus of restricted diffusion in the LEFT mid
gland at the junction of the peripheral zone and transitional zone
measuring 8 mm by 7 mm (image 10, series 2000). This lesion
corresponds to a subtle loss of signal intensity within the
peripheral zone measuring 6 mm on the axial T2 series (image 16,
series 5). Lesion appears to be within the peripheral zone although
again at the border with the the transitional zone.

Second smaller focus of restricted diffusion RIGHT lateral mid gland
measuring 5 mm (image 12, series 2000. Is also a subtle loss of
signal intensity on T2 weighted imaging.

No clear abnormal enhancement associated these 2 lesions.

The transitional zone is nodular with well capsulated nodules.

Transcapsular spread:  Absent

Seminal vesicle involvement: Absent

Neurovascular bundle involvement: Absent

Pelvic adenopathy: Absent

Bone metastasis: Absent

Other findings: Prostate measures 57 x 37 by 43 mm.
IMPRESSION: 1. Two suspicious lesions for high-grade carcinoma although
indeterminate due to small size and lack of enhancement (PI-RADS 3).
One lesion measuring 8 mm in the LEFT mid gland and a second
measuring 6 mm in the RIGHT lateral mid gland. LEFT mid gland lesion
is at the border of the transitional zone and peripheral zone could
represent and extruded hyperplastic nodule.
2. Prostatic capsule is intact.  No lymphadenopathy identified.

## 2017-06-17 DIAGNOSIS — Z0000 Encounter for general adult medical examination without abnormal findings: Secondary | ICD-10-CM | POA: Diagnosis not present

## 2017-06-17 DIAGNOSIS — Z1211 Encounter for screening for malignant neoplasm of colon: Secondary | ICD-10-CM | POA: Diagnosis not present

## 2017-06-17 DIAGNOSIS — Z1389 Encounter for screening for other disorder: Secondary | ICD-10-CM | POA: Diagnosis not present

## 2017-06-17 DIAGNOSIS — Z125 Encounter for screening for malignant neoplasm of prostate: Secondary | ICD-10-CM | POA: Diagnosis not present

## 2017-06-17 DIAGNOSIS — R5383 Other fatigue: Secondary | ICD-10-CM | POA: Diagnosis not present

## 2017-06-17 DIAGNOSIS — E7800 Pure hypercholesterolemia, unspecified: Secondary | ICD-10-CM | POA: Diagnosis not present

## 2017-06-17 DIAGNOSIS — Z7189 Other specified counseling: Secondary | ICD-10-CM | POA: Diagnosis not present

## 2017-06-17 DIAGNOSIS — N400 Enlarged prostate without lower urinary tract symptoms: Secondary | ICD-10-CM | POA: Diagnosis not present

## 2017-06-17 DIAGNOSIS — Z6823 Body mass index (BMI) 23.0-23.9, adult: Secondary | ICD-10-CM | POA: Diagnosis not present

## 2017-06-17 DIAGNOSIS — Z299 Encounter for prophylactic measures, unspecified: Secondary | ICD-10-CM | POA: Diagnosis not present

## 2017-07-08 DIAGNOSIS — H353132 Nonexudative age-related macular degeneration, bilateral, intermediate dry stage: Secondary | ICD-10-CM | POA: Diagnosis not present

## 2017-07-08 DIAGNOSIS — H43821 Vitreomacular adhesion, right eye: Secondary | ICD-10-CM | POA: Diagnosis not present

## 2017-07-08 DIAGNOSIS — H43811 Vitreous degeneration, right eye: Secondary | ICD-10-CM | POA: Diagnosis not present

## 2017-07-08 DIAGNOSIS — H353212 Exudative age-related macular degeneration, right eye, with inactive choroidal neovascularization: Secondary | ICD-10-CM | POA: Diagnosis not present

## 2017-07-27 DIAGNOSIS — D225 Melanocytic nevi of trunk: Secondary | ICD-10-CM | POA: Diagnosis not present

## 2017-07-27 DIAGNOSIS — L814 Other melanin hyperpigmentation: Secondary | ICD-10-CM | POA: Diagnosis not present

## 2017-07-27 DIAGNOSIS — L570 Actinic keratosis: Secondary | ICD-10-CM | POA: Diagnosis not present

## 2017-07-27 DIAGNOSIS — D2261 Melanocytic nevi of right upper limb, including shoulder: Secondary | ICD-10-CM | POA: Diagnosis not present

## 2017-07-27 DIAGNOSIS — D5780 Other sickle-cell disorders without crisis: Secondary | ICD-10-CM | POA: Diagnosis not present

## 2017-11-17 DIAGNOSIS — H353211 Exudative age-related macular degeneration, right eye, with active choroidal neovascularization: Secondary | ICD-10-CM | POA: Diagnosis not present

## 2017-11-17 DIAGNOSIS — H43811 Vitreous degeneration, right eye: Secondary | ICD-10-CM | POA: Diagnosis not present

## 2017-11-17 DIAGNOSIS — H353132 Nonexudative age-related macular degeneration, bilateral, intermediate dry stage: Secondary | ICD-10-CM | POA: Diagnosis not present

## 2017-11-17 DIAGNOSIS — H353212 Exudative age-related macular degeneration, right eye, with inactive choroidal neovascularization: Secondary | ICD-10-CM | POA: Diagnosis not present

## 2017-11-30 DIAGNOSIS — R972 Elevated prostate specific antigen [PSA]: Secondary | ICD-10-CM | POA: Diagnosis not present

## 2017-11-30 DIAGNOSIS — R8271 Bacteriuria: Secondary | ICD-10-CM | POA: Diagnosis not present

## 2017-12-15 DIAGNOSIS — H353212 Exudative age-related macular degeneration, right eye, with inactive choroidal neovascularization: Secondary | ICD-10-CM | POA: Diagnosis not present

## 2017-12-15 DIAGNOSIS — H353132 Nonexudative age-related macular degeneration, bilateral, intermediate dry stage: Secondary | ICD-10-CM | POA: Diagnosis not present

## 2017-12-15 DIAGNOSIS — H353211 Exudative age-related macular degeneration, right eye, with active choroidal neovascularization: Secondary | ICD-10-CM | POA: Diagnosis not present

## 2018-01-26 DIAGNOSIS — H353132 Nonexudative age-related macular degeneration, bilateral, intermediate dry stage: Secondary | ICD-10-CM | POA: Diagnosis not present

## 2018-01-26 DIAGNOSIS — H353212 Exudative age-related macular degeneration, right eye, with inactive choroidal neovascularization: Secondary | ICD-10-CM | POA: Diagnosis not present

## 2018-01-26 DIAGNOSIS — H353211 Exudative age-related macular degeneration, right eye, with active choroidal neovascularization: Secondary | ICD-10-CM | POA: Diagnosis not present

## 2018-02-02 DIAGNOSIS — L578 Other skin changes due to chronic exposure to nonionizing radiation: Secondary | ICD-10-CM | POA: Diagnosis not present

## 2018-02-02 DIAGNOSIS — Z85828 Personal history of other malignant neoplasm of skin: Secondary | ICD-10-CM | POA: Diagnosis not present

## 2018-02-02 DIAGNOSIS — D0439 Carcinoma in situ of skin of other parts of face: Secondary | ICD-10-CM | POA: Diagnosis not present

## 2018-02-02 DIAGNOSIS — L570 Actinic keratosis: Secondary | ICD-10-CM | POA: Diagnosis not present

## 2018-02-02 DIAGNOSIS — L821 Other seborrheic keratosis: Secondary | ICD-10-CM | POA: Diagnosis not present

## 2018-02-03 DIAGNOSIS — D485 Neoplasm of uncertain behavior of skin: Secondary | ICD-10-CM | POA: Diagnosis not present

## 2018-03-27 DIAGNOSIS — H43811 Vitreous degeneration, right eye: Secondary | ICD-10-CM | POA: Diagnosis not present

## 2018-03-27 DIAGNOSIS — H353212 Exudative age-related macular degeneration, right eye, with inactive choroidal neovascularization: Secondary | ICD-10-CM | POA: Diagnosis not present

## 2018-03-27 DIAGNOSIS — H353132 Nonexudative age-related macular degeneration, bilateral, intermediate dry stage: Secondary | ICD-10-CM | POA: Diagnosis not present

## 2018-03-27 DIAGNOSIS — H43821 Vitreomacular adhesion, right eye: Secondary | ICD-10-CM | POA: Diagnosis not present

## 2018-05-08 DIAGNOSIS — H353211 Exudative age-related macular degeneration, right eye, with active choroidal neovascularization: Secondary | ICD-10-CM | POA: Diagnosis not present

## 2018-05-08 DIAGNOSIS — H43811 Vitreous degeneration, right eye: Secondary | ICD-10-CM | POA: Diagnosis not present

## 2018-05-08 DIAGNOSIS — H353132 Nonexudative age-related macular degeneration, bilateral, intermediate dry stage: Secondary | ICD-10-CM | POA: Diagnosis not present

## 2018-05-08 DIAGNOSIS — H353212 Exudative age-related macular degeneration, right eye, with inactive choroidal neovascularization: Secondary | ICD-10-CM | POA: Diagnosis not present

## 2018-06-22 DIAGNOSIS — R5383 Other fatigue: Secondary | ICD-10-CM | POA: Diagnosis not present

## 2018-06-22 DIAGNOSIS — Z0000 Encounter for general adult medical examination without abnormal findings: Secondary | ICD-10-CM | POA: Diagnosis not present

## 2018-06-22 DIAGNOSIS — Z1331 Encounter for screening for depression: Secondary | ICD-10-CM | POA: Diagnosis not present

## 2018-06-22 DIAGNOSIS — Z6823 Body mass index (BMI) 23.0-23.9, adult: Secondary | ICD-10-CM | POA: Diagnosis not present

## 2018-06-22 DIAGNOSIS — Z299 Encounter for prophylactic measures, unspecified: Secondary | ICD-10-CM | POA: Diagnosis not present

## 2018-06-22 DIAGNOSIS — N400 Enlarged prostate without lower urinary tract symptoms: Secondary | ICD-10-CM | POA: Diagnosis not present

## 2018-06-22 DIAGNOSIS — Z79899 Other long term (current) drug therapy: Secondary | ICD-10-CM | POA: Diagnosis not present

## 2018-06-22 DIAGNOSIS — Z7189 Other specified counseling: Secondary | ICD-10-CM | POA: Diagnosis not present

## 2018-06-22 DIAGNOSIS — Z1211 Encounter for screening for malignant neoplasm of colon: Secondary | ICD-10-CM | POA: Diagnosis not present

## 2018-06-22 DIAGNOSIS — E7800 Pure hypercholesterolemia, unspecified: Secondary | ICD-10-CM | POA: Diagnosis not present

## 2018-06-22 DIAGNOSIS — Z1339 Encounter for screening examination for other mental health and behavioral disorders: Secondary | ICD-10-CM | POA: Diagnosis not present

## 2018-06-28 DIAGNOSIS — R972 Elevated prostate specific antigen [PSA]: Secondary | ICD-10-CM | POA: Diagnosis not present

## 2018-09-19 DIAGNOSIS — Z85828 Personal history of other malignant neoplasm of skin: Secondary | ICD-10-CM | POA: Diagnosis not present

## 2018-09-19 DIAGNOSIS — L570 Actinic keratosis: Secondary | ICD-10-CM | POA: Diagnosis not present

## 2018-09-19 DIAGNOSIS — L821 Other seborrheic keratosis: Secondary | ICD-10-CM | POA: Diagnosis not present

## 2018-09-19 DIAGNOSIS — L578 Other skin changes due to chronic exposure to nonionizing radiation: Secondary | ICD-10-CM | POA: Diagnosis not present

## 2018-10-23 DIAGNOSIS — H353211 Exudative age-related macular degeneration, right eye, with active choroidal neovascularization: Secondary | ICD-10-CM | POA: Diagnosis not present

## 2018-10-23 DIAGNOSIS — H43811 Vitreous degeneration, right eye: Secondary | ICD-10-CM | POA: Diagnosis not present

## 2018-10-23 DIAGNOSIS — H353212 Exudative age-related macular degeneration, right eye, with inactive choroidal neovascularization: Secondary | ICD-10-CM | POA: Diagnosis not present

## 2018-10-23 DIAGNOSIS — H353132 Nonexudative age-related macular degeneration, bilateral, intermediate dry stage: Secondary | ICD-10-CM | POA: Diagnosis not present

## 2019-04-13 DIAGNOSIS — C44612 Basal cell carcinoma of skin of right upper limb, including shoulder: Secondary | ICD-10-CM | POA: Diagnosis not present

## 2019-04-13 DIAGNOSIS — L821 Other seborrheic keratosis: Secondary | ICD-10-CM | POA: Diagnosis not present

## 2019-04-13 DIAGNOSIS — Z85828 Personal history of other malignant neoplasm of skin: Secondary | ICD-10-CM | POA: Diagnosis not present

## 2019-04-13 DIAGNOSIS — L578 Other skin changes due to chronic exposure to nonionizing radiation: Secondary | ICD-10-CM | POA: Diagnosis not present

## 2019-04-13 DIAGNOSIS — L570 Actinic keratosis: Secondary | ICD-10-CM | POA: Diagnosis not present

## 2019-04-26 DIAGNOSIS — H353132 Nonexudative age-related macular degeneration, bilateral, intermediate dry stage: Secondary | ICD-10-CM | POA: Diagnosis not present

## 2019-04-26 DIAGNOSIS — H43811 Vitreous degeneration, right eye: Secondary | ICD-10-CM | POA: Diagnosis not present

## 2019-04-26 DIAGNOSIS — H2511 Age-related nuclear cataract, right eye: Secondary | ICD-10-CM | POA: Diagnosis not present

## 2019-04-26 DIAGNOSIS — H353212 Exudative age-related macular degeneration, right eye, with inactive choroidal neovascularization: Secondary | ICD-10-CM | POA: Diagnosis not present

## 2019-05-07 DIAGNOSIS — D485 Neoplasm of uncertain behavior of skin: Secondary | ICD-10-CM | POA: Diagnosis not present

## 2019-06-20 DIAGNOSIS — R972 Elevated prostate specific antigen [PSA]: Secondary | ICD-10-CM | POA: Diagnosis not present

## 2019-06-27 DIAGNOSIS — R972 Elevated prostate specific antigen [PSA]: Secondary | ICD-10-CM | POA: Diagnosis not present

## 2019-06-27 DIAGNOSIS — R8271 Bacteriuria: Secondary | ICD-10-CM | POA: Diagnosis not present

## 2019-10-15 DIAGNOSIS — L570 Actinic keratosis: Secondary | ICD-10-CM | POA: Diagnosis not present

## 2019-10-15 DIAGNOSIS — L821 Other seborrheic keratosis: Secondary | ICD-10-CM | POA: Diagnosis not present

## 2019-10-15 DIAGNOSIS — L578 Other skin changes due to chronic exposure to nonionizing radiation: Secondary | ICD-10-CM | POA: Diagnosis not present

## 2019-10-15 DIAGNOSIS — D044 Carcinoma in situ of skin of scalp and neck: Secondary | ICD-10-CM | POA: Diagnosis not present

## 2019-10-15 DIAGNOSIS — Z85828 Personal history of other malignant neoplasm of skin: Secondary | ICD-10-CM | POA: Diagnosis not present

## 2019-10-17 DIAGNOSIS — D485 Neoplasm of uncertain behavior of skin: Secondary | ICD-10-CM | POA: Diagnosis not present

## 2019-10-23 DIAGNOSIS — H2511 Age-related nuclear cataract, right eye: Secondary | ICD-10-CM | POA: Diagnosis not present

## 2019-10-23 DIAGNOSIS — H353212 Exudative age-related macular degeneration, right eye, with inactive choroidal neovascularization: Secondary | ICD-10-CM | POA: Diagnosis not present

## 2019-10-23 DIAGNOSIS — H353132 Nonexudative age-related macular degeneration, bilateral, intermediate dry stage: Secondary | ICD-10-CM | POA: Diagnosis not present

## 2019-10-23 DIAGNOSIS — H43811 Vitreous degeneration, right eye: Secondary | ICD-10-CM | POA: Diagnosis not present

## 2020-02-18 DIAGNOSIS — C44629 Squamous cell carcinoma of skin of left upper limb, including shoulder: Secondary | ICD-10-CM | POA: Diagnosis not present

## 2020-02-18 DIAGNOSIS — L570 Actinic keratosis: Secondary | ICD-10-CM | POA: Diagnosis not present

## 2020-02-19 DIAGNOSIS — D485 Neoplasm of uncertain behavior of skin: Secondary | ICD-10-CM | POA: Diagnosis not present

## 2020-04-03 DIAGNOSIS — L570 Actinic keratosis: Secondary | ICD-10-CM | POA: Diagnosis not present

## 2020-04-03 DIAGNOSIS — L821 Other seborrheic keratosis: Secondary | ICD-10-CM | POA: Diagnosis not present

## 2020-04-03 DIAGNOSIS — L578 Other skin changes due to chronic exposure to nonionizing radiation: Secondary | ICD-10-CM | POA: Diagnosis not present

## 2020-04-03 DIAGNOSIS — Z85828 Personal history of other malignant neoplasm of skin: Secondary | ICD-10-CM | POA: Diagnosis not present

## 2020-04-03 DIAGNOSIS — C44519 Basal cell carcinoma of skin of other part of trunk: Secondary | ICD-10-CM | POA: Diagnosis not present

## 2020-04-07 DIAGNOSIS — D485 Neoplasm of uncertain behavior of skin: Secondary | ICD-10-CM | POA: Diagnosis not present

## 2020-04-22 ENCOUNTER — Other Ambulatory Visit: Payer: Self-pay

## 2020-04-22 ENCOUNTER — Ambulatory Visit (INDEPENDENT_AMBULATORY_CARE_PROVIDER_SITE_OTHER): Payer: Medicare Other | Admitting: Ophthalmology

## 2020-04-22 ENCOUNTER — Encounter (INDEPENDENT_AMBULATORY_CARE_PROVIDER_SITE_OTHER): Payer: Self-pay | Admitting: Ophthalmology

## 2020-04-22 DIAGNOSIS — H353132 Nonexudative age-related macular degeneration, bilateral, intermediate dry stage: Secondary | ICD-10-CM

## 2020-04-22 DIAGNOSIS — H40019 Open angle with borderline findings, low risk, unspecified eye: Secondary | ICD-10-CM | POA: Diagnosis not present

## 2020-04-22 DIAGNOSIS — H43811 Vitreous degeneration, right eye: Secondary | ICD-10-CM

## 2020-04-22 DIAGNOSIS — H353211 Exudative age-related macular degeneration, right eye, with active choroidal neovascularization: Secondary | ICD-10-CM | POA: Insufficient documentation

## 2020-04-22 DIAGNOSIS — H353212 Exudative age-related macular degeneration, right eye, with inactive choroidal neovascularization: Secondary | ICD-10-CM | POA: Diagnosis not present

## 2020-04-22 NOTE — Assessment & Plan Note (Signed)

## 2020-04-22 NOTE — Progress Notes (Signed)
04/22/2020     CHIEF COMPLAINT Patient presents for Retina Follow Up   HISTORY OF PRESENT ILLNESS: Erik Ware is a 74 y.o. male who presents to the clinic today for:   HPI    Retina Follow Up    Patient presents with  Dry AMD.  In both eyes.  This started 6 months ago.  Severity is mild.  Duration of 6 months.  Since onset it is stable.          Comments    6 Month AMD F/U OU  Pt denies noticeable changes to [place] OU since last visit. Pt denies ocular pain, flashes of light, or floaters OU. Pt denies distortion OU.        Last edited by Rockie Neighbours, [place] on 04/22/2020 10:05 AM. (History)      Referring physician: Monico Blitz, MD [place],  [place] [address]  HISTORICAL INFORMATION:   Selected notes from the [facility]: No current outpatient medications on file. (Ophthalmic Drugs)   No current facility-administered medications for this visit. (Ophthalmic Drugs)   Current Outpatient Medications (Other)  Medication Sig  . Multiple Vitamins-Minerals (PRESERVISION AREDS 2 PO) Take 1 capsule by mouth 2 (two) times daily.   No current facility-administered medications for this visit. (Other)      REVIEW OF SYSTEMS:    ALLERGIES No Known Allergies  PAST MEDICAL HISTORY Past Medical History:  Diagnosis Date  . Medical history non-contributory    Past Surgical History:  Procedure Laterality Date  . APPENDECTOMY    . BACK SURGERY    . COLONOSCOPY N/A 12/06/2013   Procedure: COLONOSCOPY;  Surgeon: Rogene Houston, MD;  Location: [facility];  Service: Endoscopy;  Laterality: N/A;  830  . COLONOSCOPY N/A 04/21/2017   Procedure: COLONOSCOPY;  Surgeon: Rogene Houston, MD;  Location: [facility];  Service: Endoscopy;  Laterality: N/A;  1200  . HERNIA REPAIR    . KNEE ARTHROSCOPY Right   . Left hip pinning    . POLYPECTOMY  04/21/2017   Procedure: POLYPECTOMY;  Surgeon: Rogene Houston, MD;  Location: [facility];  Service: Endoscopy;;  cecal x3    FAMILY HISTORY Family History  Problem Relation Age of Onset  . Thyroid cancer Mother   . Breast cancer Mother   . Prostate cancer Father   . Colon cancer Neg Hx     SOCIAL HISTORY Social History   Tobacco Use  . Smoking status: Never Smoker  . Smokeless tobacco: Never Used  Substance Use Topics  . Alcohol use: Yes    Alcohol/week: 2.0 standard drinks    Types: 2 Standard drinks or equivalent per week    Comment: brandy - maybe 2 drinks per week  . Drug use: No         OPHTHALMIC EXAM:  Base Eye Exam    Visual Acuity (ETDRS)      Right Left   Dist [place] 20/50 +2 20/30 +1   Dist ph [place] 20/30 +2 20/20 -1       Tonometry (Tonopen, 10:09 AM)      Right Left   Pressure 15 17       Pupils      Pupils Dark Light Shape React APD   Right PERRL 4 3 Round Brisk None   Left PERRL 4 3 Round Brisk None       Visual Fields (Counting  fingers)      Left Right    Full Full       Extraocular Movement      Right Left    Full Full       Neuro/Psych    Oriented x3: Yes   Mood/Affect: Normal       Dilation    Both eyes: 1.0% Mydriacyl, 2.5% Phenylephrine @ 10:09 AM        Slit Lamp and Fundus Exam    External Exam      Right Left   External Normal Normal       Slit Lamp Exam      Right Left   Lids/Lashes Normal Normal   Conjunctiva/Sclera White and quiet White and quiet   Cornea Clear Clear   Anterior Chamber Deep and quiet Deep and quiet   Iris Round and reactive Round and reactive   Lens 2+ Nuclear sclerosis 2+ Nuclear sclerosis   Anterior Vitreous Normal Normal       Fundus Exam      Right Left   Posterior Vitreous Posterior vitreous detachment Normal   Disc Normal Normal color, pink, yet there is vertical of allergy to the cup.  This might suggest early glaucomatous change.   C/D Ratio 0.45 0.65   Macula Retinal pigment epithelial mottling, no macular thickening, no hemorrhage, no exudates Hard drusen,  Retinal pigment epithelial mottling, no macular thickening, no hemorrhage   Vessels Normal Normal   Periphery Normal Normal          IMAGING AND PROCEDURES  Imaging and Procedures for 04/22/20  OCT, Retina - OU - Both Eyes       Right Eye Central Foveal Thickness: 213. Progression has been stable. Findings include abnormal foveal contour, outer retinal atrophy, central retinal atrophy, retinal drusen .   Left Eye Central Foveal Thickness: 245. Progression has been stable. Findings include abnormal foveal contour, outer retinal atrophy, central retinal atrophy, retinal drusen .   Notes No active CN VM in either eye.                ASSESSMENT/PLAN:  Intermediate stage nonexudative age-related macular degeneration of both eyes The nature of age--related macular degeneration was discussed with the patient as well as the distinction between dry and wet types. Checking an [place] Grid daily with advice to return immediately should a distortion develop, was given to the patient. The patient 's smoking status now and in the past was determined and advice based on the AREDS study was provided regarding the consumption of antioxidant supplements. AREDS 2 vitamin formulation was recommended. Consumption of dark leafy vegetables and fresh fruits of various colors was recommended. Treatment modalities for wet macular degeneration particularly the use of intravitreal injections of anti-blood vessel growth factors was discussed with the patient. Avastin, Lucentis, and Eylea are the available options. On occasion, therapy includes the use of photodynamic therapy and thermal laser. Stressed to the patient do not rub eyes. All patient questions were answered.      ICD-10-CM   1. 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 Posterior vitreous detachment of right eye  H43.811   4. 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  Macular degeneration, OU each eye without complication today.  2.  Findings of the optic nerve the left eye suggest possible glaucoma even with normal intraocular pressures.  Will suggest formal evaluation with a general ophthalmologist  3.  Ophthalmic Meds Ordered this visit:  No orders of the defined types were placed in this encounter.      Return in about 1 year (around 04/22/2021) for DILATE OU, OCT.  There are no Patient Instructions on file for this visit.   Explained the diagnoses, plan, and follow up with the patient and they expressed understanding.  Patient expressed understanding of the importance of proper follow up care.   Clent Demark Ivah Girardot M.D. Diseases & Surgery of the Retina and Vitreous [facility]abetic [facility] 04/22/20     Abbreviations: M myopia (nearsighted); A astigmatism; H hyperopia (farsighted); P presbyopia; Mrx spectacle prescription;  CTL contact lenses; OD right eye; OS left eye; OU both eyes  XT exotropia; ET esotropia; PEK punctate epithelial keratitis; PEE punctate epithelial erosions; DES dry eye syndrome; MGD meibomian gland dysfunction; ATs artificial tears; PFAT's preservative free artificial tears; [place] nuclear sclerotic cataract; PSC posterior subcapsular cataract; ERM epi-retinal membrane; PVD posterior vitreous detachment; RD retinal detachment; DM diabetes mellitus; DR diabetic retinopathy; NPDR non-proliferative diabetic retinopathy; PDR proliferative diabetic retinopathy; CSME clinically significant macular edema; DME diabetic macular edema; dbh dot blot hemorrhages; CWS cotton wool spot; POAG primary open angle glaucoma; C/D cup-to-disc ratio; HVF humphrey visual field; GVF goldmann visual field; OCT optical coherence tomography; IOP intraocular pressure; BRVO Branch retinal vein  occlusion; CRVO central retinal vein occlusion; CRAO central retinal artery occlusion; BRAO branch retinal artery occlusion; RT retinal tear; SB scleral buckle; PPV pars plana vitrectomy; VH Vitreous hemorrhage; PRP panretinal laser photocoagulation; IVK intravitreal kenalog; VMT vitreomacular traction; MH Macular hole;  NVD neovascularization of the disc; NVE neovascularization elsewhere; AREDS age related eye disease study; ARMD age related macular degeneration; POAG primary open angle glaucoma; EBMD epithelial/anterior basement membrane dystrophy; ACIOL anterior chamber intraocular lens; IOL intraocular lens; PCIOL posterior chamber intraocular lens; Phaco/IOL phacoemulsification with intraocular lens placement; [place] photorefractive keratectomy; LASIK laser assisted in situ keratomileusis; HTN hypertension; DM diabetes mellitus; COPD chronic obstructive pulmonary disease

## 2020-05-14 DIAGNOSIS — H40023 Open angle with borderline findings, high risk, bilateral: Secondary | ICD-10-CM | POA: Diagnosis not present

## 2020-05-14 DIAGNOSIS — H25812 Combined forms of age-related cataract, left eye: Secondary | ICD-10-CM | POA: Diagnosis not present

## 2020-05-14 DIAGNOSIS — H47092 Other disorders of optic nerve, not elsewhere classified, left eye: Secondary | ICD-10-CM | POA: Diagnosis not present

## 2020-05-14 DIAGNOSIS — H353112 Nonexudative age-related macular degeneration, right eye, intermediate dry stage: Secondary | ICD-10-CM | POA: Diagnosis not present

## 2020-05-14 DIAGNOSIS — H2511 Age-related nuclear cataract, right eye: Secondary | ICD-10-CM | POA: Diagnosis not present

## 2020-06-18 DIAGNOSIS — H40023 Open angle with borderline findings, high risk, bilateral: Secondary | ICD-10-CM | POA: Diagnosis not present

## 2020-06-20 DIAGNOSIS — R972 Elevated prostate specific antigen [PSA]: Secondary | ICD-10-CM | POA: Diagnosis not present

## 2020-07-04 DIAGNOSIS — R351 Nocturia: Secondary | ICD-10-CM | POA: Diagnosis not present

## 2020-07-04 DIAGNOSIS — R972 Elevated prostate specific antigen [PSA]: Secondary | ICD-10-CM | POA: Diagnosis not present

## 2020-07-04 DIAGNOSIS — N401 Enlarged prostate with lower urinary tract symptoms: Secondary | ICD-10-CM | POA: Diagnosis not present

## 2020-07-04 DIAGNOSIS — R8271 Bacteriuria: Secondary | ICD-10-CM | POA: Diagnosis not present

## 2020-10-08 DIAGNOSIS — Z85828 Personal history of other malignant neoplasm of skin: Secondary | ICD-10-CM | POA: Diagnosis not present

## 2020-10-08 DIAGNOSIS — L821 Other seborrheic keratosis: Secondary | ICD-10-CM | POA: Diagnosis not present

## 2020-10-08 DIAGNOSIS — L578 Other skin changes due to chronic exposure to nonionizing radiation: Secondary | ICD-10-CM | POA: Diagnosis not present

## 2020-10-08 DIAGNOSIS — C44519 Basal cell carcinoma of skin of other part of trunk: Secondary | ICD-10-CM | POA: Diagnosis not present

## 2020-10-08 DIAGNOSIS — L570 Actinic keratosis: Secondary | ICD-10-CM | POA: Diagnosis not present

## 2020-10-13 DIAGNOSIS — D485 Neoplasm of uncertain behavior of skin: Secondary | ICD-10-CM | POA: Diagnosis not present

## 2020-11-17 DIAGNOSIS — H401122 Primary open-angle glaucoma, left eye, moderate stage: Secondary | ICD-10-CM | POA: Diagnosis not present

## 2020-11-17 DIAGNOSIS — H40021 Open angle with borderline findings, high risk, right eye: Secondary | ICD-10-CM | POA: Diagnosis not present

## 2020-11-17 DIAGNOSIS — H353132 Nonexudative age-related macular degeneration, bilateral, intermediate dry stage: Secondary | ICD-10-CM | POA: Diagnosis not present

## 2020-11-17 DIAGNOSIS — H2513 Age-related nuclear cataract, bilateral: Secondary | ICD-10-CM | POA: Diagnosis not present

## 2021-04-01 DIAGNOSIS — L821 Other seborrheic keratosis: Secondary | ICD-10-CM | POA: Diagnosis not present

## 2021-04-01 DIAGNOSIS — C44319 Basal cell carcinoma of skin of other parts of face: Secondary | ICD-10-CM | POA: Diagnosis not present

## 2021-04-01 DIAGNOSIS — Z85828 Personal history of other malignant neoplasm of skin: Secondary | ICD-10-CM | POA: Diagnosis not present

## 2021-04-01 DIAGNOSIS — L578 Other skin changes due to chronic exposure to nonionizing radiation: Secondary | ICD-10-CM | POA: Diagnosis not present

## 2021-04-01 DIAGNOSIS — L570 Actinic keratosis: Secondary | ICD-10-CM | POA: Diagnosis not present

## 2021-04-03 DIAGNOSIS — D485 Neoplasm of uncertain behavior of skin: Secondary | ICD-10-CM | POA: Diagnosis not present

## 2021-04-20 ENCOUNTER — Encounter (INDEPENDENT_AMBULATORY_CARE_PROVIDER_SITE_OTHER): Payer: Self-pay

## 2021-04-20 DIAGNOSIS — H43811 Vitreous degeneration, right eye: Secondary | ICD-10-CM | POA: Diagnosis not present

## 2021-04-20 DIAGNOSIS — H353132 Nonexudative age-related macular degeneration, bilateral, intermediate dry stage: Secondary | ICD-10-CM | POA: Diagnosis not present

## 2021-04-20 DIAGNOSIS — H2513 Age-related nuclear cataract, bilateral: Secondary | ICD-10-CM | POA: Diagnosis not present

## 2021-04-20 DIAGNOSIS — H401122 Primary open-angle glaucoma, left eye, moderate stage: Secondary | ICD-10-CM | POA: Diagnosis not present

## 2021-04-20 DIAGNOSIS — H40021 Open angle with borderline findings, high risk, right eye: Secondary | ICD-10-CM | POA: Diagnosis not present

## 2021-04-22 ENCOUNTER — Encounter (INDEPENDENT_AMBULATORY_CARE_PROVIDER_SITE_OTHER): Payer: Self-pay | Admitting: Ophthalmology

## 2021-04-22 ENCOUNTER — Ambulatory Visit (INDEPENDENT_AMBULATORY_CARE_PROVIDER_SITE_OTHER): Payer: Medicare Other | Admitting: Ophthalmology

## 2021-04-22 ENCOUNTER — Other Ambulatory Visit: Payer: Self-pay

## 2021-04-22 DIAGNOSIS — H2512 Age-related nuclear cataract, left eye: Secondary | ICD-10-CM

## 2021-04-22 DIAGNOSIS — H2511 Age-related nuclear cataract, right eye: Secondary | ICD-10-CM

## 2021-04-22 DIAGNOSIS — H353211 Exudative age-related macular degeneration, right eye, with active choroidal neovascularization: Secondary | ICD-10-CM | POA: Diagnosis not present

## 2021-04-22 DIAGNOSIS — H353212 Exudative age-related macular degeneration, right eye, with inactive choroidal neovascularization: Secondary | ICD-10-CM

## 2021-04-22 HISTORY — DX: Age-related nuclear cataract, right eye: H25.11

## 2021-04-22 HISTORY — DX: Age-related nuclear cataract, left eye: H25.12

## 2021-04-22 NOTE — Assessment & Plan Note (Signed)

## 2021-04-22 NOTE — Progress Notes (Signed)
04/22/2021     CHIEF COMPLAINT Patient presents for Retina Follow Up (1 Year F/U OU//Pt c/o gradually worsening [facility] OU. Pt sts, "I kind of feel like I have a film over my eyes." Pt denies any other new symptoms OU. Pt sts he is scheduled end of June for cataract sx OS, followed by OD.)   HISTORY OF PRESENT ILLNESS: Erik Ware is a 75 y.o. male who presents to the clinic today for:   HPI    Retina Follow Up    Diagnosis: Wet AMD   Laterality: right eye   Onset: 1 year ago   Severity: mild   Duration: 1 year   Course: gradually worsening   Comments: 1 Year F/U OU  Pt c/o gradually worsening VA OU. Pt sts, "I kind of feel like I have a film over my eyes." Pt denies any other new symptoms OU. Pt sts he is scheduled end of June for cataract sx OS, followed by OD.       Last edited by Rockie Neighbours, [place] on 04/22/2021 10:47 AM. (History)      Referring physician: Monico Blitz, MD [place],  [place] [address]  HISTORICAL INFORMATION:   Selected notes from the [facility] [facility]: No current outpatient medications on file. (Ophthalmic Drugs)   No current facility-administered medications for this visit. (Ophthalmic Drugs)   Current Outpatient Medications (Other)  Medication Sig  . Multiple Vitamins-Minerals (PRESERVISION AREDS 2 PO) Take 1 capsule by mouth 2 (two) times daily.   No current facility-administered medications for this visit. (Other)      REVIEW OF SYSTEMS:    ALLERGIES No Known Allergies  PAST MEDICAL HISTORY Past Medical History:  Diagnosis Date  . Medical history non-contributory    Past Surgical History:  Procedure Laterality Date  . APPENDECTOMY    . BACK SURGERY    . COLONOSCOPY N/A 12/06/2013   Procedure: COLONOSCOPY;  Surgeon: Rogene [place], MD;  [facility];  Service: Endoscopy;  Laterality: N/A;  830  . COLONOSCOPY N/A 04/21/2017   Procedure: COLONOSCOPY;  Surgeon: Rogene [place],  MD;  [facility];  Service: Endoscopy;  Laterality: N/A;  1200  . HERNIA REPAIR    . KNEE ARTHROSCOPY Right   . Left hip pinning    . POLYPECTOMY  04/21/2017   Procedure: POLYPECTOMY;  Surgeon: Rogene [place], MD;  [facility];  Service: Endoscopy;;  cecal x3    FAMILY HISTORY Family History  Problem Relation Age of Onset  . Thyroid cancer Mother   . Breast cancer Mother   . Prostate cancer Father   . Colon cancer Neg Hx     SOCIAL HISTORY Social History   Tobacco Use  . Smoking status: Never Smoker  . Smokeless tobacco: Never Used  Vaping Use  . Vaping Use: Never used  Substance Use Topics  . Alcohol use: Yes    Alcohol/week: 2.0 standard drinks    Types: 2 Standard drinks or equivalent per week    Comment: brandy - maybe 2 drinks per week  . Drug use: No         OPHTHALMIC EXAM: Base Eye Exam    Visual Acuity (ETDRS)      Right Left   Dist [place] 20/25 +2 20/20 -2       Tonometry (Tonopen, 10:51 AM)      Right Left   Pressure  12 12       Pupils      Pupils Dark Light Shape React APD   Right PERRL 6 5 Round Brisk None   Left PERRL 6 5 Round Brisk None       Visual Fields (Counting fingers)      Left Right    Full Full       Extraocular Movement      Right Left    Full Full       Neuro/Psych    Oriented x3: Yes   Mood/Affect: Normal       Dilation    Both eyes: 1.0% Mydriacyl, 2.5% Phenylephrine @ 10:51 AM        Slit Lamp and Fundus Exam    External Exam      Right Left   External Normal Normal       Slit Lamp Exam      Right Left   Lids/Lashes Normal Normal   Conjunctiva/Sclera White and quiet White and quiet   Cornea Clear Clear   Anterior Chamber Deep and quiet Deep and quiet   Iris Round and reactive Round and reactive   Lens 2+ Nuclear sclerosis 2+ Nuclear sclerosis   Anterior Vitreous Normal Normal       Fundus Exam      Right Left   Posterior Vitreous Posterior vitreous detachment Normal   Disc  Normal Normal color, pink, yet there is vertical of allergy to the cup.  This might suggest early glaucomatous change.   C/D Ratio 0.6 0.65   Macula Retinal pigment epithelial mottling, no macular thickening, no hemorrhage, no exudates, no fluid seen clinically Hard drusen, Retinal pigment epithelial mottling, no macular thickening, no hemorrhage   Vessels Normal Normal   Periphery Normal Normal          IMAGING AND PROCEDURES  Imaging and Procedures for 04/22/21  OCT, Retina - OU - Both Eyes       Right Eye Quality was good. Scan locations included subfoveal. Central Foveal Thickness: 211. Progression has been stable. Findings include abnormal foveal contour, outer retinal atrophy, central retinal atrophy, retinal drusen , pigment epithelial detachment.   Left Eye Quality was good. Scan locations included subfoveal. Central Foveal Thickness: 246. Progression has been stable. Findings include abnormal foveal contour, outer retinal atrophy, central retinal atrophy, retinal drusen .   Notes Old pigment epithelial detachment in the right eye nasal to the fovea now with some outer retinal Clefting that could be early subretinal fluid.  OD.  Will need to follow-up in 2 weeks and if any change occurs may need to reinstitute therapy.                ASSESSMENT/PLAN:  Nuclear sclerotic cataract of right eye The nature of cataract was discussed with the patient as well as the elective nature of surgery. The patient was reassured that surgery at a later date does not put the patient at risk for a worse outcome. It was emphasized that the need for surgery is dictated by the patient's quality of life as influenced by the cataract. Patient was instructed to maintain close follow up with their general eye care doctor.  Exudative age-related macular degeneration of right eye with active choroidal neovascularization ([place]) Possible early signs of outer retinal edema in the region of prior PED  which required therapy.  Will monitor and follow-up closely in 2 weeks follow-up likely injection Avastin if any changes detected  ICD-10-CM   1. 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 Nuclear sclerotic cataract of right eye  H25.11   3. Nuclear sclerotic cataract of left eye  H25.12   4. Exudative age-related macular degeneration of right eye with active choroidal neovascularization ([place])  H35.3211     1.  Routine follow-up today OCT evidence of possible reactivation of CNVM OD yet very early signs and not conclusive.  We will follow-up in 2 weeks with repeat OCT OD.  2.  Bilateral cataracts under the care of [facility], may proceed with cataract extraction as planned in either eye  3.  Ophthalmic Meds Ordered this visit:  No orders of the defined types were placed in this encounter.      Return in about 2 weeks (around 05/06/2021) for OD, dilate, possible AVASTIN OCT.  There are no Patient Instructions on file for this visit.   Explained the diagnoses, plan, and follow up with the patient and they expressed understanding.  Patient expressed understanding of the importance of proper follow up care.   Clent Demark Xitlalic Maslin M.D. Diseases & Surgery of the Retina and Vitreous Retina & Diabetic [place] 04/22/21     Abbreviations: M myopia (nearsighted); A astigmatism; H hyperopia (farsighted); P presbyopia; Mrx spectacle prescription;  CTL contact lenses; OD right eye; OS left eye; OU both eyes  XT exotropia; ET esotropia; PEK punctate epithelial keratitis; PEE punctate epithelial erosions; DES dry eye syndrome; MGD meibomian gland dysfunction; ATs artificial tears; PFAT's preservative free artificial tears; [place] nuclear sclerotic cataract; PSC posterior subcapsular cataract; ERM epi-retinal membrane; PVD posterior vitreous detachment; RD retinal detachment; DM diabetes  mellitus; DR diabetic retinopathy; NPDR non-proliferative diabetic retinopathy; PDR proliferative diabetic retinopathy; CSME clinically significant macular edema; DME diabetic macular edema; dbh dot blot hemorrhages; CWS cotton wool spot; POAG primary open angle glaucoma; C/D cup-to-disc ratio; HVF humphrey visual field; GVF goldmann visual field; OCT optical coherence tomography; IOP intraocular pressure; BRVO Branch retinal vein occlusion; CRVO central retinal vein occlusion; CRAO central retinal artery occlusion; BRAO branch retinal artery occlusion; RT retinal tear; SB scleral buckle; PPV pars plana vitrectomy; VH Vitreous hemorrhage; PRP panretinal laser photocoagulation; IVK intravitreal kenalog; VMT vitreomacular traction; MH Macular hole;  NVD neovascularization of the disc; NVE neovascularization elsewhere; AREDS age related eye disease study; ARMD age related macular degeneration; POAG primary open angle glaucoma; EBMD epithelial/anterior basement membrane dystrophy; ACIOL anterior chamber intraocular lens; IOL intraocular lens; PCIOL posterior chamber intraocular lens; Phaco/IOL phacoemulsification with intraocular lens placement; [place] photorefractive keratectomy; LASIK laser assisted in situ keratomileusis; HTN hypertension; DM diabetes mellitus; COPD chronic obstructive pulmonary disease

## 2021-04-22 NOTE — Assessment & Plan Note (Signed)
Possible early signs of outer retinal edema in the region of prior PED which required therapy.  Will monitor and follow-up closely in 2 weeks follow-up likely injection Avastin if any changes detected

## 2021-05-06 ENCOUNTER — Encounter (INDEPENDENT_AMBULATORY_CARE_PROVIDER_SITE_OTHER): Payer: Medicare Other | Admitting: Ophthalmology

## 2021-05-07 ENCOUNTER — Ambulatory Visit (INDEPENDENT_AMBULATORY_CARE_PROVIDER_SITE_OTHER): Payer: Medicare Other | Admitting: Ophthalmology

## 2021-05-07 ENCOUNTER — Encounter (INDEPENDENT_AMBULATORY_CARE_PROVIDER_SITE_OTHER): Payer: Self-pay | Admitting: Ophthalmology

## 2021-05-07 ENCOUNTER — Other Ambulatory Visit: Payer: Self-pay

## 2021-05-07 DIAGNOSIS — N401 Enlarged prostate with lower urinary tract symptoms: Secondary | ICD-10-CM | POA: Diagnosis not present

## 2021-05-07 DIAGNOSIS — H353132 Nonexudative age-related macular degeneration, bilateral, intermediate dry stage: Secondary | ICD-10-CM | POA: Diagnosis not present

## 2021-05-07 DIAGNOSIS — H353211 Exudative age-related macular degeneration, right eye, with active choroidal neovascularization: Secondary | ICD-10-CM

## 2021-05-07 DIAGNOSIS — H353212 Exudative age-related macular degeneration, right eye, with inactive choroidal neovascularization: Secondary | ICD-10-CM

## 2021-05-07 MED ORDER — BEVACIZUMAB 2.5 MG/0.1ML IZ SOSY
2.5000 mg | PREFILLED_SYRINGE | INTRAVITREAL | Status: AC | PRN
  Administered 2021-05-07: 15:00:00 2.5 mg via INTRAVITREAL

## 2021-05-07 NOTE — Assessment & Plan Note (Signed)
Continue on vitamin therapy

## 2021-05-07 NOTE — Progress Notes (Signed)
05/07/2021     CHIEF COMPLAINT Patient presents for Retina Follow Up (2 Wk F/U OD, poss Avastin OD//Pt denies noticeable changes to [place] OU since last visit. Pt denies ocular pain, flashes of light, or floaters OU. //)   HISTORY OF PRESENT ILLNESS: Erik Ware is a 75 y.o. male who presents to the clinic today for:   HPI     Retina Follow Up           Diagnosis: Wet AMD   Laterality: right eye   Onset: 2 weeks ago   Severity: mild   Duration: 2 weeks   Course: stable   Comments: 2 Wk F/U OD, poss Avastin OD  Pt denies noticeable changes to [place] OU since last visit. Pt denies ocular pain, flashes of light, or floaters OU.          Last edited by Rockie Neighbours, [place] on 05/07/2021  2:12 PM.      Referring physician: Monico Blitz, MD [place],  [place] [address]  HISTORICAL INFORMATION:   Selected notes from the [facility]: No current outpatient medications on file. (Ophthalmic Drugs)   No current facility-administered medications for this visit. (Ophthalmic Drugs)   Current Outpatient Medications (Other)  Medication Sig   Multiple Vitamins-Minerals (PRESERVISION AREDS 2 PO) Take 1 capsule by mouth 2 (two) times daily.   No current facility-administered medications for this visit. (Other)      REVIEW OF SYSTEMS:    ALLERGIES No Known Allergies  PAST MEDICAL HISTORY Past Medical History:  Diagnosis Date   Medical history non-contributory    Past Surgical History:  Procedure Laterality Date   APPENDECTOMY     BACK SURGERY     COLONOSCOPY N/A 12/06/2013   Procedure: COLONOSCOPY;  Surgeon: Rogene Houston, MD;  [facility];  Service: Endoscopy;  Laterality: N/A;  830   COLONOSCOPY N/A 04/21/2017   Procedure: COLONOSCOPY;  Surgeon: Rogene Houston, MD;  [facility];  Service: Endoscopy;  Laterality: N/A;  1200   HERNIA REPAIR     KNEE ARTHROSCOPY Right    Left hip pinning      POLYPECTOMY  04/21/2017   Procedure: POLYPECTOMY;  Surgeon: Rogene Houston, MD;  [facility];  Service: Endoscopy;;  cecal x3    FAMILY HISTORY Family History  Problem Relation Age of Onset   Thyroid cancer Mother    Breast cancer Mother    Prostate cancer Father    Colon cancer Neg Hx     SOCIAL HISTORY Social History   Tobacco Use   Smoking status: Never   Smokeless tobacco: Never  Vaping Use   Vaping Use: Never used  Substance Use Topics   Alcohol use: Yes    Alcohol/week: 2.0 standard drinks    Types: 2 Standard drinks or equivalent per week    Comment: brandy - maybe 2 drinks per week   Drug use: No         OPHTHALMIC EXAM:  Base Eye Exam     Visual Acuity (ETDRS)       Right Left   Dist [place] 20/40 20/25 -1   Dist ph [place] 20/30          Tonometry (Tonopen, 2:15 PM)       Right Left   Pressure 08 09         Pupils  Pupils Dark Light Shape React APD   Right PERRL 6 5 Round Brisk None   Left PERRL 6 5 Round Brisk None         Visual Fields (Counting fingers)       Left Right    Full Full         Extraocular Movement       Right Left    Full Full         Neuro/Psych     Oriented x3: Yes   Mood/Affect: Normal         Dilation     Right eye: 1.0% Mydriacyl, 2.5% Phenylephrine @ 2:15 PM           Slit Lamp and Fundus Exam     External Exam       Right Left   External Normal Normal         Slit Lamp Exam       Right Left   Lids/Lashes Normal Normal   Conjunctiva/Sclera White and quiet White and quiet   Cornea Clear Clear   Anterior Chamber Deep and quiet Deep and quiet   Iris Round and reactive Round and reactive   Lens 2+ Nuclear sclerosis 2+ Nuclear sclerosis   Anterior Vitreous Normal Normal         Fundus Exam       Right Left   Posterior Vitreous Posterior vitreous detachment    Disc Normal    C/D Ratio 0.6    Macula Retinal pigment epithelial mottling, no macular thickening,  no hemorrhage, no exudates, no fluid seen clinically    Vessels Normal    Periphery Normal             IMAGING AND PROCEDURES  Imaging and Procedures for 05/07/21  OCT, Retina - OU - Both Eyes       Right Eye Quality was good. Scan locations included subfoveal. Central Foveal Thickness: 211. Progression has been stable. Findings include abnormal foveal contour, outer retinal atrophy, central retinal atrophy, retinal drusen , pigment epithelial detachment.   Left Eye Quality was good. Scan locations included subfoveal. Central Foveal Thickness: 243. Progression has been stable. Findings include abnormal foveal contour, outer retinal atrophy, central retinal atrophy, retinal drusen .   Notes Old pigment epithelial detachment in the right eye nasal to the fovea now with some outer retinal Clefting that could be early subretinal fluid.  OD.  Small slightly enlarged cleft of fluid nasal to the fovea near PED slightly bigger than 2 weeks previous.  We will thus commence with therapy OD     Intravitreal Injection, Pharmacologic Agent - OD - Right Eye       Time Out 05/07/2021. 2:36 PM. Confirmed correct patient, procedure, site, and patient consented.   Anesthesia Topical anesthesia was used. Anesthetic medications included [place] 3.5%.   Procedure Preparation included Tobramycin 0.3%, 10% betadine to eyelids, Ofloxacin , 5% betadine to ocular surface. A 30 gauge needle was used.   Injection: 2.5 mg bevacizumab 2.5 MG/0.1ML   Route: Intravitreal   NDC: [phone], Lot: [id number]   Post-op Post injection exam found visual acuity of at least counting fingers. The patient tolerated the procedure well. There were no complications. The patient received written and verbal post procedure care education. Post injection medications were not given.              ASSESSMENT/PLAN:  Exudative age-related macular degeneration of right eye with active choroidal neovascularization  (HCC) New subretinal  fluid nasal to the fovea slightly enlarged although small and only seen on OCT OD today.  We will commence with therapy with intravitreal Avastin today and follow-up next in 5 weeks  Intermediate stage nonexudative age-related macular degeneration of both eyes Continue on vitamin therapy     ICD-10-CM   1. Exudative age-related macular degeneration of right eye with active choroidal neovascularization (HCC)  H35.3211 Intravitreal Injection, Pharmacologic Agent - OD - Right Eye    bevacizumab (AVASTIN) SOSY 2.5 mg    2. Exudative age-related macular degeneration of right eye with inactive choroidal neovascularization (HCC)  H35.3212 OCT, Retina - OU - Both Eyes    3. Intermediate stage nonexudative age-related macular degeneration of both eyes  H35.3132       1.  OU with intermediate ARMD.  2.  OS with small area of outer retinal subretinal fluid nasal to the fovea seen 2 weeks previous which is slowly increased in size over last 7 months.  We will treat today #1 Avastin and look for resolution next.  3.  Dilate OD next likely injection Avastin again looking for change in configuration of this region nasal to the fovea  Ophthalmic Meds Ordered this visit:  Meds ordered this encounter  Medications   bevacizumab (AVASTIN) SOSY 2.5 mg       Return in about 5 weeks (around 06/11/2021) for dilate, OD, AVASTIN OCT.  There are no Patient Instructions on file for this visit.   Explained the diagnoses, plan, and follow up with the patient and they expressed understanding.  Patient expressed understanding of the importance of proper follow up care.   Clent Demark Kyleeann Cremeans M.D. Diseases & Surgery of the Retina and Vitreous Retina & Diabetic [place] 05/07/21     Abbreviations: M myopia (nearsighted); A astigmatism; H hyperopia (farsighted); P presbyopia; Mrx spectacle prescription;  CTL contact lenses; OD right eye; OS left eye; OU both eyes  XT exotropia; ET  esotropia; PEK punctate epithelial keratitis; PEE punctate epithelial erosions; DES dry eye syndrome; MGD meibomian gland dysfunction; ATs artificial tears; PFAT's preservative free artificial tears; [place] nuclear sclerotic cataract; PSC posterior subcapsular cataract; ERM epi-retinal membrane; PVD posterior vitreous detachment; RD retinal detachment; DM diabetes mellitus; DR diabetic retinopathy; NPDR non-proliferative diabetic retinopathy; PDR proliferative diabetic retinopathy; CSME clinically significant macular edema; DME diabetic macular edema; dbh dot blot hemorrhages; CWS cotton wool spot; POAG primary open angle glaucoma; C/D cup-to-disc ratio; HVF humphrey visual field; GVF goldmann visual field; OCT optical coherence tomography; IOP intraocular pressure; BRVO Branch retinal vein occlusion; CRVO central retinal vein occlusion; CRAO central retinal artery occlusion; BRAO branch retinal artery occlusion; RT retinal tear; SB scleral buckle; PPV pars plana vitrectomy; VH Vitreous hemorrhage; PRP panretinal laser photocoagulation; IVK intravitreal kenalog; VMT vitreomacular traction; MH Macular hole;  NVD neovascularization of the disc; NVE neovascularization elsewhere; AREDS age related eye disease study; ARMD age related macular degeneration; POAG primary open angle glaucoma; EBMD epithelial/anterior basement membrane dystrophy; ACIOL anterior chamber intraocular lens; IOL intraocular lens; PCIOL posterior chamber intraocular lens; Phaco/IOL phacoemulsification with intraocular lens placement; [place] photorefractive keratectomy; LASIK laser assisted in situ keratomileusis; HTN hypertension; DM diabetes mellitus; COPD chronic obstructive pulmonary disease

## 2021-05-07 NOTE — Assessment & Plan Note (Signed)
New subretinal fluid nasal to the fovea slightly enlarged although small and only seen on OCT OD today.  We will commence with therapy with intravitreal Avastin today and follow-up next in 5 weeks

## 2021-05-13 DIAGNOSIS — N401 Enlarged prostate with lower urinary tract symptoms: Secondary | ICD-10-CM | POA: Diagnosis not present

## 2021-05-13 DIAGNOSIS — R972 Elevated prostate specific antigen [PSA]: Secondary | ICD-10-CM | POA: Diagnosis not present

## 2021-05-13 DIAGNOSIS — R8271 Bacteriuria: Secondary | ICD-10-CM | POA: Diagnosis not present

## 2021-05-13 DIAGNOSIS — R351 Nocturia: Secondary | ICD-10-CM | POA: Diagnosis not present

## 2021-05-20 DIAGNOSIS — Z1339 Encounter for screening examination for other mental health and behavioral disorders: Secondary | ICD-10-CM | POA: Diagnosis not present

## 2021-05-20 DIAGNOSIS — Z6823 Body mass index (BMI) 23.0-23.9, adult: Secondary | ICD-10-CM | POA: Diagnosis not present

## 2021-05-20 DIAGNOSIS — R5383 Other fatigue: Secondary | ICD-10-CM | POA: Diagnosis not present

## 2021-05-20 DIAGNOSIS — E7800 Pure hypercholesterolemia, unspecified: Secondary | ICD-10-CM | POA: Diagnosis not present

## 2021-05-20 DIAGNOSIS — Z7189 Other specified counseling: Secondary | ICD-10-CM | POA: Diagnosis not present

## 2021-05-20 DIAGNOSIS — R030 Elevated blood-pressure reading, without diagnosis of hypertension: Secondary | ICD-10-CM | POA: Diagnosis not present

## 2021-05-20 DIAGNOSIS — Z79899 Other long term (current) drug therapy: Secondary | ICD-10-CM | POA: Diagnosis not present

## 2021-05-20 DIAGNOSIS — Z299 Encounter for prophylactic measures, unspecified: Secondary | ICD-10-CM | POA: Diagnosis not present

## 2021-05-20 DIAGNOSIS — Z0000 Encounter for general adult medical examination without abnormal findings: Secondary | ICD-10-CM | POA: Diagnosis not present

## 2021-05-20 DIAGNOSIS — Z1331 Encounter for screening for depression: Secondary | ICD-10-CM | POA: Diagnosis not present

## 2021-05-28 DIAGNOSIS — H2512 Age-related nuclear cataract, left eye: Secondary | ICD-10-CM | POA: Diagnosis not present

## 2021-05-28 DIAGNOSIS — H401122 Primary open-angle glaucoma, left eye, moderate stage: Secondary | ICD-10-CM | POA: Diagnosis not present

## 2021-06-11 ENCOUNTER — Encounter (INDEPENDENT_AMBULATORY_CARE_PROVIDER_SITE_OTHER): Payer: Self-pay | Admitting: Ophthalmology

## 2021-06-11 ENCOUNTER — Other Ambulatory Visit: Payer: Self-pay

## 2021-06-11 ENCOUNTER — Ambulatory Visit (INDEPENDENT_AMBULATORY_CARE_PROVIDER_SITE_OTHER): Payer: Medicare Other | Admitting: Ophthalmology

## 2021-06-11 DIAGNOSIS — H353211 Exudative age-related macular degeneration, right eye, with active choroidal neovascularization: Secondary | ICD-10-CM | POA: Diagnosis not present

## 2021-06-11 DIAGNOSIS — H2511 Age-related nuclear cataract, right eye: Secondary | ICD-10-CM

## 2021-06-11 DIAGNOSIS — H353212 Exudative age-related macular degeneration, right eye, with inactive choroidal neovascularization: Secondary | ICD-10-CM | POA: Diagnosis not present

## 2021-06-11 MED ORDER — BEVACIZUMAB 2.5 MG/0.1ML IZ SOSY
2.5000 mg | PREFILLED_SYRINGE | INTRAVITREAL | Status: AC | PRN
  Administered 2021-06-11: 2.5 mg via INTRAVITREAL

## 2021-06-11 NOTE — Progress Notes (Signed)
06/11/2021     CHIEF COMPLAINT Patient presents for Retina Follow Up (5 week fu OD and Avastin OD/Pt states, "I had car sx on OS two weeks ago with Dr. Katy Fitch and it went well. I will be doing first week in September for my OD. I am still using the gtts I just do not know which ones they are."/)   HISTORY OF PRESENT ILLNESS: Erik Ware is a 75 y.o. male who presents to the clinic today for:   HPI     Retina Follow Up           Diagnosis: Wet AMD   Laterality: right eye   Onset: 5 weeks ago   Severity: mild   Duration: 5 weeks   Course: gradually improving   Comments: 5 week fu OD and Avastin OD Pt states, "I had car sx on OS two weeks ago with Dr. Katy Fitch and it went well. I will be doing first week in September for my OD. I am still using the gtts I just do not know which ones they are."        Last edited by Kendra Opitz, COA on 06/11/2021  1:43 PM.      Referring physician: Monico Blitz, MD [place],  [place] [address]  HISTORICAL INFORMATION:   Selected notes from the [facility]: No current outpatient medications on file. (Ophthalmic Drugs)   No current facility-administered medications for this visit. (Ophthalmic Drugs)   Current Outpatient Medications (Other)  Medication Sig   Multiple Vitamins-Minerals (PRESERVISION AREDS 2 PO) Take 1 capsule by mouth 2 (two) times daily.   No current facility-administered medications for this visit. (Other)      REVIEW OF SYSTEMS:    ALLERGIES No Known Allergies  PAST MEDICAL HISTORY Past Medical History:  Diagnosis Date   Medical history non-contributory    Nuclear sclerotic cataract of left eye 04/22/2021   Past Surgical History:  Procedure Laterality Date   APPENDECTOMY     BACK SURGERY     COLONOSCOPY N/A 12/06/2013   Procedure: COLONOSCOPY;  Surgeon: Rogene Houston, MD;  [facility];  Service: Endoscopy;  Laterality: N/A;  830   COLONOSCOPY  N/A 04/21/2017   Procedure: COLONOSCOPY;  Surgeon: Rogene Houston, MD;  [facility];  Service: Endoscopy;  Laterality: N/A;  1200   HERNIA REPAIR     KNEE ARTHROSCOPY Right    Left hip pinning     POLYPECTOMY  04/21/2017   Procedure: POLYPECTOMY;  Surgeon: Rogene Houston, MD;  [facility];  Service: Endoscopy;;  cecal x3    FAMILY HISTORY Family History  Problem Relation Age of Onset   Thyroid cancer Mother    Breast cancer Mother    Prostate cancer Father    Colon cancer Neg Hx     SOCIAL HISTORY Social History   Tobacco Use   Smoking status: Never   Smokeless tobacco: Never  Vaping Use   Vaping Use: Never used  Substance Use Topics   Alcohol use: Yes    Alcohol/week: 2.0 standard drinks    Types: 2 Standard drinks or equivalent per week    Comment: brandy - maybe 2 drinks per week   Drug use: No         OPHTHALMIC EXAM:  Base Eye Exam     Visual Acuity (ETDRS)       Right  Left   Dist [place] 20/100 20/25 +2   Dist ph [place] 20/40          Tonometry (Tonopen, 1:45 PM)       Right Left   Pressure 14 11         Pupils       Pupils Dark Light Shape React APD   Right PERRL 6 5 Round Brisk None   Left PERRL 6 5 Round Brisk None         Visual Fields (Counting fingers)       Left Right    Full Full         Extraocular Movement       Right Left    Full Full         Neuro/Psych     Oriented x3: Yes   Mood/Affect: Normal         Dilation     Right eye: 1.0% Mydriacyl, 2.5% Phenylephrine @ 1:45 PM           Slit Lamp and Fundus Exam     External Exam       Right Left   External Normal Normal         Slit Lamp Exam       Right Left   Lids/Lashes Normal Normal   Conjunctiva/Sclera White and quiet White and quiet   Cornea Clear Clear   Anterior Chamber Deep and quiet Deep and quiet   Iris Round and reactive Round and reactive   Lens 2+ Nuclear sclerosis Posterior chamber intraocular lens    Anterior Vitreous Normal Normal         Fundus Exam       Right Left   Posterior Vitreous Posterior vitreous detachment    Disc Normal    C/D Ratio 0.6    Macula Retinal pigment epithelial mottling, no macular thickening, no hemorrhage, no exudates, no fluid seen clinically    Vessels Normal    Periphery Normal             IMAGING AND PROCEDURES  Imaging and Procedures for 06/11/21  OCT, Retina - OU - Both Eyes       Right Eye Quality was good. Scan locations included subfoveal. Central Foveal Thickness: 211. Progression has been stable. Findings include abnormal foveal contour, outer retinal atrophy, central retinal atrophy, retinal drusen , pigment epithelial detachment.   Left Eye Quality was good. Scan locations included subfoveal. Central Foveal Thickness: 247. Progression has been stable. Findings include abnormal foveal contour, outer retinal atrophy, central retinal atrophy, retinal drusen .   Notes Old pigment epithelial detachment in the right eye nasal to the fovea now with some outer retinal Clefting that could be early subretinal fluid.  OD.   Improved macula anatomy at 5 weeks, post Avastin, We will thus repeat injection OD today and examination OD next in 6 weeks     Intravitreal Injection, Pharmacologic Agent - OD - Right Eye       Time Out 06/11/2021. 2:02 PM. Confirmed correct patient, procedure, site, and patient consented.   Anesthesia Topical anesthesia was used. Anesthetic medications included Akten 3.5%.   Procedure Preparation included Tobramycin 0.3%, 10% betadine to eyelids, Ofloxacin , 5% betadine to ocular surface. A 30 gauge needle was used.   Injection: 2.5 mg bevacizumab 2.5 MG/0.1ML   Route: Intravitreal, Site: Right Eye   NDC: [phone], Lot: [id number]   Post-op Post injection exam found visual acuity of at least counting fingers.  The patient tolerated the procedure well. There were no complications. The patient received written  and verbal post procedure care education. Post injection medications were not given.              ASSESSMENT/PLAN:  Nuclear sclerotic cataract of right eye Planned cataract removal September 2022  Exudative age-related macular degeneration of right eye with active choroidal neovascularization ([place]) Improved less subretinal fluid adjacent to vascularized PED at 5-week interval post Avastin.  Repeat injection today     ICD-10-CM   1. Exudative age-related macular degeneration of right eye with active choroidal neovascularization (HCC)  H35.3211 Intravitreal Injection, Pharmacologic Agent - OD - Right Eye    bevacizumab (AVASTIN) SOSY 2.5 mg    2. Exudative age-related macular degeneration of right eye with inactive choroidal neovascularization (HCC)  H35.3212 OCT, Retina - OU - Both Eyes    3. Nuclear sclerotic cataract of right eye  H25.11       1.  Much less subretinal fluid adjacent to a vascularized PED OD, post Avastin usage injection 5 weeks previous.  We will repeat injection today and examination next in 6 weeks  2.  Patient has planned cataract extraction with intraocular lens placement OD September 2022  3.  OS remained stable observe  Ophthalmic Meds Ordered this visit:  Meds ordered this encounter  Medications   bevacizumab (AVASTIN) SOSY 2.5 mg       Return in about 6 weeks (around 07/23/2021) for dilate, OD, AVASTIN OCT.  There are no Patient Instructions on file for this visit.   Explained the diagnoses, plan, and follow up with the patient and they expressed understanding.  Patient expressed understanding of the importance of proper follow up care.   Clent Demark Delano Frate M.D. Diseases & Surgery of the Retina and Vitreous Retina & Diabetic [place] 06/11/21     Abbreviations: M myopia (nearsighted); A astigmatism; H hyperopia (farsighted); P presbyopia; Mrx spectacle prescription;  CTL contact lenses; OD right eye; OS left eye; OU both eyes  XT  exotropia; ET esotropia; PEK punctate epithelial keratitis; PEE punctate epithelial erosions; DES dry eye syndrome; MGD meibomian gland dysfunction; ATs artificial tears; PFAT's preservative free artificial tears; [place] nuclear sclerotic cataract; PSC posterior subcapsular cataract; ERM epi-retinal membrane; PVD posterior vitreous detachment; RD retinal detachment; DM diabetes mellitus; DR diabetic retinopathy; NPDR non-proliferative diabetic retinopathy; PDR proliferative diabetic retinopathy; CSME clinically significant macular edema; DME diabetic macular edema; dbh dot blot hemorrhages; CWS cotton wool spot; POAG primary open angle glaucoma; C/D cup-to-disc ratio; HVF humphrey visual field; GVF goldmann visual field; OCT optical coherence tomography; IOP intraocular pressure; BRVO Branch retinal vein occlusion; CRVO central retinal vein occlusion; CRAO central retinal artery occlusion; BRAO branch retinal artery occlusion; RT retinal tear; SB scleral buckle; PPV pars plana vitrectomy; VH Vitreous hemorrhage; PRP panretinal laser photocoagulation; IVK intravitreal kenalog; VMT vitreomacular traction; MH Macular hole;  NVD neovascularization of the disc; NVE neovascularization elsewhere; AREDS age related eye disease study; ARMD age related macular degeneration; POAG primary open angle glaucoma; EBMD epithelial/anterior basement membrane dystrophy; ACIOL anterior chamber intraocular lens; IOL intraocular lens; PCIOL posterior chamber intraocular lens; Phaco/IOL phacoemulsification with intraocular lens placement; [place] photorefractive keratectomy; LASIK laser assisted in situ keratomileusis; HTN hypertension; DM diabetes mellitus; COPD chronic obstructive pulmonary disease

## 2021-06-11 NOTE — Assessment & Plan Note (Signed)
Planned cataract removal September 2022

## 2021-06-11 NOTE — Assessment & Plan Note (Signed)
Improved less subretinal fluid adjacent to vascularized PED at 5-week interval post Avastin.  Repeat injection today

## 2021-07-13 DIAGNOSIS — H2511 Age-related nuclear cataract, right eye: Secondary | ICD-10-CM | POA: Diagnosis not present

## 2021-07-16 DIAGNOSIS — H2511 Age-related nuclear cataract, right eye: Secondary | ICD-10-CM | POA: Diagnosis not present

## 2021-07-27 ENCOUNTER — Other Ambulatory Visit: Payer: Self-pay

## 2021-07-27 ENCOUNTER — Ambulatory Visit (INDEPENDENT_AMBULATORY_CARE_PROVIDER_SITE_OTHER): Payer: Medicare Other | Admitting: Ophthalmology

## 2021-07-27 ENCOUNTER — Encounter (INDEPENDENT_AMBULATORY_CARE_PROVIDER_SITE_OTHER): Payer: Self-pay | Admitting: Ophthalmology

## 2021-07-27 DIAGNOSIS — H353211 Exudative age-related macular degeneration, right eye, with active choroidal neovascularization: Secondary | ICD-10-CM | POA: Diagnosis not present

## 2021-07-27 DIAGNOSIS — H2511 Age-related nuclear cataract, right eye: Secondary | ICD-10-CM

## 2021-07-27 DIAGNOSIS — H353212 Exudative age-related macular degeneration, right eye, with inactive choroidal neovascularization: Secondary | ICD-10-CM

## 2021-07-27 DIAGNOSIS — H353132 Nonexudative age-related macular degeneration, bilateral, intermediate dry stage: Secondary | ICD-10-CM

## 2021-07-27 MED ORDER — BEVACIZUMAB 2.5 MG/0.1ML IZ SOSY
2.5000 mg | PREFILLED_SYRINGE | INTRAVITREAL | Status: AC | PRN
  Administered 2021-07-27: 2.5 mg via INTRAVITREAL

## 2021-07-27 NOTE — Assessment & Plan Note (Signed)

## 2021-07-27 NOTE — Assessment & Plan Note (Signed)
Cataract surgery completed in the past recent weeks

## 2021-07-27 NOTE — Assessment & Plan Note (Signed)
OD vastly improved macular anatomy as compared to onset of recent CNVM.  We will repeat injection today and follow-up again in 6 weeks OD

## 2021-07-27 NOTE — Progress Notes (Signed)
07/27/2021     CHIEF COMPLAINT Patient presents for  Chief Complaint  Patient presents with   Retina Follow Up    5 week fu OD and Avastin OD Pt states, "I had car sx on OS two weeks ago with Dr. Katy Fitch and it went well. I will be doing first week in September for my OD. I am still using the gtts I just do not know which ones they are."       HISTORY OF PRESENT ILLNESS: Erik Ware is a 75 y.o. male who presents to the clinic today for:   HPI     Retina Follow Up   Patient presents with  Wet AMD.  In right eye.  This started 6 weeks ago.  Severity is mild.  Duration of 6 weeks.  Since onset it is gradually improving. Additional comments: 5 week fu OD and Avastin OD Pt states, "I had car sx on OS two weeks ago with Dr. Katy Fitch and it went well. I will be doing first week in September for my OD. I am still using the gtts I just do not know which ones they are."         Comments   6 week fu od oct avastin od Patient states vision is stable and unchanged since last visit. Denies any new floaters or FOL. Pt states he had cataract sx on his right eye 07/16/2021. Patient is currently using prednisolone OD tid, tapering bid next week and qd. Ketorolac OD qid until Thursday 07/30/2021.      Last edited by Laurin Coder, COA on 07/27/2021  1:41 PM.      Referring physician: Warden Fillers, MD [address] [place],  [place] [address]  HISTORICAL INFORMATION:   Selected notes from the MEDICAL RECORD NUMBER       CURRENT MEDICATIONS: No current outpatient medications on file. (Ophthalmic Drugs)   No current facility-administered medications for this visit. (Ophthalmic Drugs)   Current Outpatient Medications (Other)  Medication Sig   Multiple Vitamins-Minerals (PRESERVISION AREDS 2 PO) Take 1 capsule by mouth 2 (two) times daily.   No current facility-administered medications for this visit. (Other)      REVIEW OF SYSTEMS:    ALLERGIES No Known  Allergies  PAST MEDICAL HISTORY Past Medical History:  Diagnosis Date   Medical history non-contributory    Nuclear sclerotic cataract of left eye 04/22/2021   Nuclear sclerotic cataract of right eye 04/22/2021   Under the care of Dr. Carolynn Sayers   Past Surgical History:  Procedure Laterality Date   APPENDECTOMY     BACK SURGERY     COLONOSCOPY N/A 12/06/2013   Procedure: COLONOSCOPY;  Surgeon: Rogene Houston, MD;  [facility];  Service: Endoscopy;  Laterality: N/A;  830   COLONOSCOPY N/A 04/21/2017   Procedure: COLONOSCOPY;  Surgeon: Rogene Houston, MD;  [facility];  Service: Endoscopy;  Laterality: N/A;  1200   HERNIA REPAIR     KNEE ARTHROSCOPY Right    Left hip pinning     POLYPECTOMY  04/21/2017   Procedure: POLYPECTOMY;  Surgeon: Rogene Houston, MD;  [facility];  Service: Endoscopy;;  cecal x3    FAMILY HISTORY Family History  Problem Relation Age of Onset   Thyroid cancer Mother    Breast cancer Mother    Prostate cancer Father    Colon cancer Neg Hx     SOCIAL HISTORY Social History  Tobacco Use   Smoking status: Never   Smokeless tobacco: Never  Vaping Use   Vaping Use: Never used  Substance Use Topics   Alcohol use: Yes    Alcohol/week: 2.0 standard drinks    Types: 2 Standard drinks or equivalent per week    Comment: brandy - maybe 2 drinks per week   Drug use: No         OPHTHALMIC EXAM:  Base Eye Exam     Visual Acuity (ETDRS)       Right Left   Dist [place] 20/30 -2 20/25         Tonometry (Tonopen, 1:46 PM)       Right Left   Pressure 5 5         Pupils       Pupils Dark Light React APD   Right PERRL 6 5 Brisk None   Left PERRL 6 5.5 Minimal None         Extraocular Movement       Right Left    Full Full         Neuro/Psych     Oriented x3: Yes   Mood/Affect: Normal         Dilation     Both eyes: 1.0% Mydriacyl, 2.5% Phenylephrine @ 1:46 PM           Slit Lamp and  Fundus Exam     External Exam       Right Left   External Normal Normal         Slit Lamp Exam       Right Left   Lids/Lashes Normal Normal   Conjunctiva/Sclera White and quiet White and quiet   Cornea Clear Clear   Anterior Chamber Deep and quiet Deep and quiet   Iris Round and reactive Round and reactive   Lens Centered posterior chamber intraocular lens Posterior chamber intraocular lens   Anterior Vitreous Normal Normal         Fundus Exam       Right Left   Posterior Vitreous Posterior vitreous detachment    Disc Normal    C/D Ratio 0.6    Macula Retinal pigment epithelial mottling, no macular thickening, no hemorrhage, no exudates, no fluid seen clinically    Vessels Normal    Periphery Normal             IMAGING AND PROCEDURES  Imaging and Procedures for 07/27/21  OCT, Retina - OU - Both Eyes       Right Eye Quality was good. Scan locations included subfoveal. Central Foveal Thickness: 207. Progression has been stable. Findings include abnormal foveal contour, outer retinal atrophy, central retinal atrophy, retinal drusen , pigment epithelial detachment.   Left Eye Quality was good. Scan locations included subfoveal. Central Foveal Thickness: 246. Progression has been stable. Findings include abnormal foveal contour, outer retinal atrophy, central retinal atrophy, retinal drusen .   Notes Old pigment epithelial detachment in the right eye nasal to the fovea now improved less intraretinal fluid     Improved macula anatomy at 6 weeks, and 4 days, post Avastin, We will thus repeat injection OD today and examination OD next in 6 weeks     Intravitreal Injection, Pharmacologic Agent - OD - Right Eye       Time Out 07/27/2021. 2:14 PM. Confirmed correct patient, procedure, site, and patient consented.   Anesthesia Topical anesthesia was used. Anesthetic medications included Akten 3.5%.   Procedure Preparation  included Tobramycin 0.3%, 10% betadine  to eyelids, Ofloxacin , 5% betadine to ocular surface. A 30 gauge needle was used.   Injection: 2.5 mg bevacizumab 2.5 MG/0.1ML   Route: Intravitreal, Site: Right Eye   NDC: [phone], Lot: [id number]   Post-op Post injection exam found visual acuity of at least counting fingers. The patient tolerated the procedure well. There were no complications. The patient received written and verbal post procedure care education. Post injection medications were not given.              ASSESSMENT/PLAN:  Exudative age-related macular degeneration of right eye with active choroidal neovascularization ([place]) OD vastly improved macular anatomy as compared to onset of recent CNVM.  We will repeat injection today and follow-up again in 6 weeks OD  Nuclear sclerotic cataract of right eye Cataract surgery completed in the past recent weeks  Intermediate stage nonexudative age-related macular degeneration of both eyes The nature of age--related macular degeneration was discussed with the patient as well as the distinction between dry and wet types. Checking an Amsler Grid daily with advice to return immediately should a distortion develop, was given to the patient. The patient 's smoking status now and in the past was determined and advice based on the AREDS study was provided regarding the consumption of antioxidant supplements. AREDS 2 vitamin formulation was recommended. Consumption of dark leafy vegetables and fresh fruits of various colors was recommended. Treatment modalities for wet macular degeneration particularly the use of intravitreal injections of anti-blood vessel growth factors was discussed with the patient. Avastin, Lucentis, and Eylea are the available options. On occasion, therapy includes the use of photodynamic therapy and thermal laser. Stressed to the patient do not rub eyes.  Patient was advised to check Amsler Grid daily and return immediately if changes are noted. Instructions on using  the grid were given to the patient. All patient questions were answered.      ICD-10-CM   1. Exudative age-related macular degeneration of right eye with inactive choroidal neovascularization (HCC)  H35.3212 OCT, Retina - OU - Both Eyes    Intravitreal Injection, Pharmacologic Agent - OD - Right Eye    bevacizumab (AVASTIN) SOSY 2.5 mg    2. Exudative age-related macular degeneration of right eye with active choroidal neovascularization ([place])  H35.3211     3. Nuclear sclerotic cataract of right eye  H25.11     4. Intermediate stage nonexudative age-related macular degeneration of both eyes  H35.3132       1.  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  Condition improving by OCT evaluation.  Recent improvement in acuity because of cataract extraction with intraocular insufflation with Dr. Duanne Guess.  2.  Follow-up as scheduled with Dr. Duanne Guess  3.  Ophthalmic Meds Ordered this visit:  Meds ordered this encounter  Medications   bevacizumab (AVASTIN) SOSY 2.5 mg       Return in about 6 weeks (around 09/07/2021) for dilate, OD, AVASTIN OCT.  There are no Patient Instructions on file for this visit.   Explained the diagnoses, plan, and follow up with the patient and they expressed understanding.  Patient expressed understanding of the importance of proper follow up care.   Clent Demark Salvador Bigbee M.D. Diseases & Surgery of the Retina and Vitreous Retina & Diabetic [place] 07/27/21     Abbreviations: M myopia (nearsighted); A astigmatism; H hyperopia (farsighted); P presbyopia; Mrx spectacle prescription;  CTL contact lenses; OD right  eye; OS left eye; OU both eyes  XT exotropia; ET esotropia; PEK punctate epithelial keratitis; PEE punctate epithelial erosions; DES dry eye syndrome; MGD meibomian gland dysfunction; ATs artificial tears; PFAT's preservative free artificial tears; [place] nuclear sclerotic cataract; PSC posterior  subcapsular cataract; ERM epi-retinal membrane; PVD posterior vitreous detachment; RD retinal detachment; DM diabetes mellitus; DR diabetic retinopathy; NPDR non-proliferative diabetic retinopathy; PDR proliferative diabetic retinopathy; CSME clinically significant macular edema; DME diabetic macular edema; dbh dot blot hemorrhages; CWS cotton wool spot; POAG primary open angle glaucoma; C/D cup-to-disc ratio; HVF humphrey visual field; GVF goldmann visual field; OCT optical coherence tomography; IOP intraocular pressure; BRVO Branch retinal vein occlusion; CRVO central retinal vein occlusion; CRAO central retinal artery occlusion; BRAO branch retinal artery occlusion; RT retinal tear; SB scleral buckle; PPV pars plana vitrectomy; VH Vitreous hemorrhage; PRP panretinal laser photocoagulation; IVK intravitreal kenalog; VMT vitreomacular traction; MH Macular hole;  NVD neovascularization of the disc; NVE neovascularization elsewhere; AREDS age related eye disease study; ARMD age related macular degeneration; POAG primary open angle glaucoma; EBMD epithelial/anterior basement membrane dystrophy; ACIOL anterior chamber intraocular lens; IOL intraocular lens; PCIOL posterior chamber intraocular lens; Phaco/IOL phacoemulsification with intraocular lens placement; [place] photorefractive keratectomy; LASIK laser assisted in situ keratomileusis; HTN hypertension; DM diabetes mellitus; COPD chronic obstructive pulmonary disease

## 2021-09-08 ENCOUNTER — Ambulatory Visit (INDEPENDENT_AMBULATORY_CARE_PROVIDER_SITE_OTHER): Payer: Medicare Other | Admitting: Ophthalmology

## 2021-09-08 ENCOUNTER — Encounter (INDEPENDENT_AMBULATORY_CARE_PROVIDER_SITE_OTHER): Payer: Self-pay | Admitting: Ophthalmology

## 2021-09-08 ENCOUNTER — Other Ambulatory Visit: Payer: Self-pay

## 2021-09-08 DIAGNOSIS — H353212 Exudative age-related macular degeneration, right eye, with inactive choroidal neovascularization: Secondary | ICD-10-CM | POA: Diagnosis not present

## 2021-09-08 DIAGNOSIS — H353211 Exudative age-related macular degeneration, right eye, with active choroidal neovascularization: Secondary | ICD-10-CM | POA: Diagnosis not present

## 2021-09-08 MED ORDER — BEVACIZUMAB 2.5 MG/0.1ML IZ SOSY
2.5000 mg | PREFILLED_SYRINGE | INTRAVITREAL | Status: AC | PRN
Start: 2021-09-08 — End: 2021-09-08
  Administered 2021-09-08: 2.5 mg via INTRAVITREAL

## 2021-09-08 NOTE — Progress Notes (Signed)
09/08/2021     CHIEF COMPLAINT Patient presents for  Chief Complaint  Patient presents with   Retina Follow Up    5 week fu OD and Avastin OD Pt states, "I had car sx on OS two weeks ago with Dr. Katy Fitch and it went well. I will be doing first week in September for my OD. I am still using the gtts I just do not know which ones they are."       HISTORY OF PRESENT ILLNESS: Erik Ware is a 75 y.o. male who presents to the clinic today for:   HPI     Retina Follow Up   Patient presents with  Wet AMD.  In right eye.  This started 6 weeks ago.  Severity is mild.  Duration of 6 weeks.  Since onset it is gradually improving. Additional comments: 5 week fu OD and Avastin OD Pt states, "I had car sx on OS two weeks ago with Dr. Katy Fitch and it went well. I will be doing first week in September for my OD. I am still using the gtts I just do not know which ones they are."         Comments   6 week fu OD oct avastin OD. Patient states vision is stable and unchanged since last visit. Denies any new floaters or FOL. Pt states he takes Preservision AREDS twice a day.      Last edited by Laurin Coder on 09/08/2021  1:39 PM.      Referring physician: Monico Blitz, MD [place],  [place] [id number]  HISTORICAL INFORMATION:   Selected notes from the [place]: No current outpatient medications on file. (Ophthalmic Drugs)   No current facility-administered medications for this visit. (Ophthalmic Drugs)   Current Outpatient Medications (Other)  Medication Sig   Multiple Vitamins-Minerals (PRESERVISION AREDS 2 PO) Take 1 capsule by mouth 2 (two) times daily.   No current facility-administered medications for this visit. (Other)      REVIEW OF SYSTEMS:    ALLERGIES No Known Allergies  PAST MEDICAL HISTORY Past Medical History:  Diagnosis Date   Medical history non-contributory    Nuclear sclerotic cataract of left eye  04/22/2021   Nuclear sclerotic cataract of right eye 04/22/2021   Under the care of Dr. Carolynn Sayers   Past Surgical History:  Procedure Laterality Date   APPENDECTOMY     BACK SURGERY     COLONOSCOPY N/A 12/06/2013   Procedure: COLONOSCOPY;  Surgeon: Rogene Houston, MD;  [facility];  Service: Endoscopy;  Laterality: N/A;  830   COLONOSCOPY N/A 04/21/2017   Procedure: COLONOSCOPY;  Surgeon: Rogene Houston, MD;  [facility];  Service: Endoscopy;  Laterality: N/A;  1200   HERNIA REPAIR     KNEE ARTHROSCOPY Right    Left hip pinning     POLYPECTOMY  04/21/2017   Procedure: POLYPECTOMY;  Surgeon: Rogene Houston, MD;  [facility];  Service: Endoscopy;;  cecal x3    FAMILY HISTORY Family History  Problem Relation Age of Onset   Thyroid cancer Mother    Breast cancer Mother    Prostate cancer Father    Colon cancer Neg Hx     SOCIAL HISTORY Social History   Tobacco Use   Smoking status: Never   Smokeless tobacco: Never  Vaping Use   Vaping Use: Never used  Substance  Use Topics   Alcohol use: Yes    Alcohol/week: 2.0 standard drinks    Types: 2 Standard drinks or equivalent per week    Comment: brandy - maybe 2 drinks per week   Drug use: No         OPHTHALMIC EXAM:  Base Eye Exam     Visual Acuity (Snellen - Linear)       Right Left   Dist [place] 20/20 20/25 +2         Tonometry (Tonopen, 1:42 PM)       Right Left   Pressure 14 13         Pupils       Pupils Dark Light Shape React APD   Right PERRL 6 5 Round Brisk None   Left PERRL 6 5.5 Round Minimal None         Extraocular Movement       Right Left    Full Full         Neuro/Psych     Oriented x3: Yes   Mood/Affect: Normal         Dilation     Both eyes: 1.0% Mydriacyl, 2.5% Phenylephrine @ 1:42 PM           Slit Lamp and Fundus Exam     External Exam       Right Left   External Normal Normal         Slit Lamp Exam       Right Left    Lids/Lashes Normal Normal   Conjunctiva/Sclera White and quiet White and quiet   Cornea Clear Clear   Anterior Chamber Deep and quiet Deep and quiet   Iris Round and reactive Round and reactive   Lens Centered posterior chamber intraocular lens Posterior chamber intraocular lens   Anterior Vitreous Normal Normal         Fundus Exam       Right Left   Posterior Vitreous Posterior vitreous detachment    Disc Normal    C/D Ratio 0.6    Macula Retinal pigment epithelial mottling, no macular thickening, no hemorrhage, no exudates, no fluid seen clinically    Vessels Normal    Periphery Normal             IMAGING AND PROCEDURES  Imaging and Procedures for 09/08/21  OCT, Retina - OU - Both Eyes       Right Eye Quality was good. Scan locations included subfoveal. Central Foveal Thickness: 211. Progression has been stable. Findings include abnormal foveal contour, outer retinal atrophy, central retinal atrophy, retinal drusen , pigment epithelial detachment.   Left Eye Quality was good. Scan locations included subfoveal. Central Foveal Thickness: 247. Progression has been stable. Findings include abnormal foveal contour, outer retinal atrophy, central retinal atrophy, retinal drusen .   Notes Old pigment epithelial detachment in the right eye nasal to the fovea now improved less intraretinal fluid     Improved macula anatomy at 6 weeks, and 1 days, post Avastin, We will thus repeat injection OD today and examination OD next in 7 weeks     Intravitreal Injection, Pharmacologic Agent - OD - Right Eye       Time Out 09/08/2021. 1:58 PM. Confirmed correct patient, procedure, site, and patient consented.   Anesthesia Topical anesthesia was used. Anesthetic medications included [place] 3.5%.   Procedure Preparation included Tobramycin 0.3%, 10% betadine to eyelids, Ofloxacin , 5% betadine to ocular surface. A 30 gauge needle  was used.   Injection: 2.5 mg bevacizumab 2.5  MG/0.1ML   Route: Intravitreal, Site: Right Eye   NDC: [phone], Lot: [id number]   Post-op Post injection exam found visual acuity of at least counting fingers. The patient tolerated the procedure well. There were no complications. The patient received written and verbal post procedure care education. Post injection medications included ocuflox.              ASSESSMENT/PLAN:  Exudative age-related macular degeneration of right eye with active choroidal neovascularization (HCC) OD, improved macular anatomy overlying vascularized PED nasal to FAZ.  Currently at 6-week interval.  We will repeat injection today to maintain and extend interval of examination next to 7 weeks     ICD-10-CM   1. Exudative age-related macular degeneration of right eye with inactive choroidal neovascularization (HCC)  H35.3212 OCT, Retina - OU - Both Eyes    Intravitreal Injection, Pharmacologic Agent - OD - Right Eye    bevacizumab (AVASTIN) SOSY 2.5 mg    2. Exudative age-related macular degeneration of right eye with active choroidal neovascularization ([place])  H35.3211       1.  OD, improved outer retinal region and overlying the vascularized PED nasal to the FAZ less intraretinal and subretinal fluid.  Currently at 6-week interval follow-up post Avastin.  We will repeat injection today and extend interval examination to 7 weeks to maintain  2.  3.  Ophthalmic Meds Ordered this visit:  Meds ordered this encounter  Medications   bevacizumab (AVASTIN) SOSY 2.5 mg       Return in about 7 weeks (around 10/27/2021) for dilate, OD, AVASTIN OCT.  There are no Patient Instructions on file for this visit.   Explained the diagnoses, plan, and follow up with the patient and they expressed understanding.  Patient expressed understanding of the importance of proper follow up care.   Clent Demark Shiah Berhow M.D. Diseases & Surgery of the Retina and Vitreous Retina & Diabetic [place] 09/08/21     Abbreviations: M myopia (nearsighted); A astigmatism; H hyperopia (farsighted); P presbyopia; Mrx spectacle prescription;  CTL contact lenses; OD right eye; OS left eye; OU both eyes  XT exotropia; ET esotropia; PEK punctate epithelial keratitis; PEE punctate epithelial erosions; DES dry eye syndrome; MGD meibomian gland dysfunction; ATs artificial tears; PFAT's preservative free artificial tears; [place] nuclear sclerotic cataract; PSC posterior subcapsular cataract; ERM epi-retinal membrane; PVD posterior vitreous detachment; RD retinal detachment; DM diabetes mellitus; DR diabetic retinopathy; NPDR non-proliferative diabetic retinopathy; PDR proliferative diabetic retinopathy; CSME clinically significant macular edema; DME diabetic macular edema; dbh dot blot hemorrhages; CWS cotton wool spot; POAG primary open angle glaucoma; C/D cup-to-disc ratio; HVF humphrey visual field; GVF goldmann visual field; OCT optical coherence tomography; IOP intraocular pressure; BRVO Branch retinal vein occlusion; CRVO central retinal vein occlusion; CRAO central retinal artery occlusion; BRAO branch retinal artery occlusion; RT retinal tear; SB scleral buckle; PPV pars plana vitrectomy; VH Vitreous hemorrhage; PRP panretinal laser photocoagulation; IVK intravitreal kenalog; VMT vitreomacular traction; MH Macular hole;  NVD neovascularization of the disc; NVE neovascularization elsewhere; AREDS age related eye disease study; ARMD age related macular degeneration; POAG primary open angle glaucoma; EBMD epithelial/anterior basement membrane dystrophy; ACIOL anterior chamber intraocular lens; IOL intraocular lens; PCIOL posterior chamber intraocular lens; Phaco/IOL phacoemulsification with intraocular lens placement; [place] photorefractive keratectomy; LASIK laser assisted in situ keratomileusis; HTN hypertension; DM diabetes mellitus; COPD chronic obstructive pulmonary disease

## 2021-09-08 NOTE — Assessment & Plan Note (Signed)
OD, improved macular anatomy overlying vascularized PED nasal to [place].  Currently at 6-week interval.  We will repeat injection today to maintain and extend interval of examination next to 7 weeks

## 2021-09-30 DIAGNOSIS — L578 Other skin changes due to chronic exposure to nonionizing radiation: Secondary | ICD-10-CM | POA: Diagnosis not present

## 2021-09-30 DIAGNOSIS — C44519 Basal cell carcinoma of skin of other part of trunk: Secondary | ICD-10-CM | POA: Diagnosis not present

## 2021-09-30 DIAGNOSIS — Z85828 Personal history of other malignant neoplasm of skin: Secondary | ICD-10-CM | POA: Diagnosis not present

## 2021-09-30 DIAGNOSIS — L821 Other seborrheic keratosis: Secondary | ICD-10-CM | POA: Diagnosis not present

## 2021-09-30 DIAGNOSIS — L570 Actinic keratosis: Secondary | ICD-10-CM | POA: Diagnosis not present

## 2021-10-02 DIAGNOSIS — D485 Neoplasm of uncertain behavior of skin: Secondary | ICD-10-CM | POA: Diagnosis not present

## 2021-10-27 ENCOUNTER — Other Ambulatory Visit: Payer: Self-pay

## 2021-10-27 ENCOUNTER — Encounter (INDEPENDENT_AMBULATORY_CARE_PROVIDER_SITE_OTHER): Payer: Self-pay | Admitting: Ophthalmology

## 2021-10-27 ENCOUNTER — Ambulatory Visit (INDEPENDENT_AMBULATORY_CARE_PROVIDER_SITE_OTHER): Payer: Medicare Other | Admitting: Ophthalmology

## 2021-10-27 DIAGNOSIS — H353211 Exudative age-related macular degeneration, right eye, with active choroidal neovascularization: Secondary | ICD-10-CM | POA: Diagnosis not present

## 2021-10-27 DIAGNOSIS — H353212 Exudative age-related macular degeneration, right eye, with inactive choroidal neovascularization: Secondary | ICD-10-CM | POA: Diagnosis not present

## 2021-10-27 MED ORDER — BEVACIZUMAB 2.5 MG/0.1ML IZ SOSY
2.5000 mg | PREFILLED_SYRINGE | INTRAVITREAL | Status: AC | PRN
  Administered 2021-10-27: 2.5 mg via INTRAVITREAL

## 2021-10-27 NOTE — Progress Notes (Signed)
10/27/2021     CHIEF COMPLAINT Patient presents for No chief complaint on file.     HISTORY OF PRESENT ILLNESS: Erik Ware is a 75 y.o. male who presents to the clinic today for:     Referring physician: Monico Blitz, MD [place],  [place] [id number]  HISTORICAL INFORMATION:   Selected notes from the [place]: No current outpatient medications on file. (Ophthalmic Drugs)   No current facility-administered medications for this visit. (Ophthalmic Drugs)   Current Outpatient Medications (Other)  Medication Sig   Multiple Vitamins-Minerals (PRESERVISION AREDS 2 PO) Take 1 capsule by mouth 2 (two) times daily.   No current facility-administered medications for this visit. (Other)      REVIEW OF SYSTEMS:    ALLERGIES No Known Allergies  PAST MEDICAL HISTORY Past Medical History:  Diagnosis Date   Medical history non-contributory    Nuclear sclerotic cataract of left eye 04/22/2021   Nuclear sclerotic cataract of right eye 04/22/2021   Under the care of Dr. Carolynn Sayers   Past Surgical History:  Procedure Laterality Date   APPENDECTOMY     BACK SURGERY     COLONOSCOPY N/A 12/06/2013   Procedure: COLONOSCOPY;  Surgeon: Rogene Houston, MD;  [facility];  Service: Endoscopy;  Laterality: N/A;  830   COLONOSCOPY N/A 04/21/2017   Procedure: COLONOSCOPY;  Surgeon: Rogene Houston, MD;  [facility];  Service: Endoscopy;  Laterality: N/A;  1200   HERNIA REPAIR     KNEE ARTHROSCOPY Right    Left hip pinning     POLYPECTOMY  04/21/2017   Procedure: POLYPECTOMY;  Surgeon: Rogene Houston, MD;  [facility];  Service: Endoscopy;;  cecal x3    FAMILY HISTORY Family History  Problem Relation Age of Onset   Thyroid cancer Mother    Breast cancer Mother    Prostate cancer Father    Colon cancer Neg Hx     SOCIAL HISTORY Social History   Tobacco Use   Smoking status: Never   Smokeless  tobacco: Never  Vaping Use   Vaping Use: Never used  Substance Use Topics   Alcohol use: Yes    Alcohol/week: 2.0 standard drinks    Types: 2 Standard drinks or equivalent per week    Comment: brandy - maybe 2 drinks per week   Drug use: No         OPHTHALMIC EXAM:  Base Eye Exam     Visual Acuity (ETDRS)       Right Left   Dist [place] 20/20 -1 20/20 -1         Tonometry (Tonopen, 1:06 PM)       Right Left   Pressure 14 9         Pupils       Pupils   Right PERRL   Left PERRL         Visual Fields       Left Right    Full Full         Extraocular Movement       Right Left    Full, Ortho Full, Ortho         Neuro/Psych     Oriented x3: Yes   Mood/Affect: Normal         Dilation     Right eye: 2.5% Phenylephrine, 1.0% Mydriacyl @ 1:06 PM  Slit Lamp and Fundus Exam     External Exam       Right Left   External Normal Normal         Slit Lamp Exam       Right Left   Lids/Lashes Normal Normal   Conjunctiva/Sclera White and quiet White and quiet   Cornea Clear Clear   Anterior Chamber Deep and quiet Deep and quiet   Iris Round and reactive Round and reactive   Lens Centered posterior chamber intraocular lens Posterior chamber intraocular lens   Anterior Vitreous Normal Normal         Fundus Exam       Right Left   Posterior Vitreous Posterior vitreous detachment    Disc Normal    C/D Ratio 0.6    Macula Retinal pigment epithelial mottling, no macular thickening, no hemorrhage, no exudates, no fluid seen clinically    Vessels Normal    Periphery Normal             IMAGING AND PROCEDURES  Imaging and Procedures for 10/27/21  OCT, Retina - OU - Both Eyes       Right Eye Quality was good. Scan locations included subfoveal. Central Foveal Thickness: 215. Progression has been stable. Findings include abnormal foveal contour, outer retinal atrophy, central retinal atrophy, retinal drusen , pigment  epithelial detachment.   Left Eye Quality was good. Scan locations included subfoveal. Central Foveal Thickness: 258. Progression has been stable. Findings include abnormal foveal contour, outer retinal atrophy, central retinal atrophy, retinal drusen .   Notes Old pigment epithelial detachment in the right eye nasal to the fovea now improved less intraretinal fluid     Improved macula anatomy at 7 weeks post Avastin, We will thus repeat injection OD today and examination OD next in 7 weeks     Intravitreal Injection, Pharmacologic Agent - OD - Right Eye       Time Out 10/27/2021. 1:27 PM. Confirmed correct patient, procedure, site, and patient consented.   Anesthesia Topical anesthesia was used. Anesthetic medications included Lidocaine 4%.   Procedure Preparation included Tobramycin 0.3%, 10% betadine to eyelids, Ofloxacin , 5% betadine to ocular surface. A 30 gauge needle was used.   Injection: 2.5 mg bevacizumab 2.5 MG/0.1ML   Route: Intravitreal, Site: Right Eye   NDC: [phone], Lot: [id number]   Post-op Post injection exam found visual acuity of at least counting fingers. The patient tolerated the procedure well. There were no complications. The patient received written and verbal post procedure care education. Post injection medications included ocuflox.              ASSESSMENT/PLAN:  Exudative age-related macular degeneration of right eye with active choroidal neovascularization (HCC) OD, improved macular anatomy yet with some foveal atrophy and outer retinal drusen.  Old PED nasal to the FAZ, no signs of active leakages no signs of active intraretinal fluid OD.  We will repeat injection today at 7-week and maintain 7-week interval at this time.  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 Exudative age-related macular degeneration of right eye with active choroidal neovascularization (HCC)  H35.3211 Intravitreal Injection,  Pharmacologic Agent - OD - Right Eye    bevacizumab (AVASTIN) SOSY 2.5 mg    2. Exudative age-related macular degeneration of right eye with inactive choroidal neovascularization (HCC)  H35.3212 OCT, Retina - OU - Both Eyes      1.  OD vastly improved macular anatomy  on therapy with preserved acuity yet still symptomatic paracentral scotomas OD.  Repeat injection Avastin OD today at 7 weeks maintain 7 weeks with a next visit and then potentially extend interval thereafter  2.  Patient reports proceeding to [facility] soon after Christmas and thus will miss his next 7-week appointment schedule here however we will maintain that appointment for recordkeeping sake and he will seek out retina care and in [facility]  3.  May schedule follow-up here in 4-5 months or soon after Easter, per his reported time of return  Ophthalmic Meds Ordered this visit:  Meds ordered this encounter  Medications   bevacizumab (AVASTIN) SOSY 2.5 mg       Return in about 7 weeks (around 12/15/2021) for DILATE OU, AVASTIN OCT, OD.  There are no Patient Instructions on file for this visit.   Explained the diagnoses, plan, and follow up with the patient and they expressed understanding.  Patient expressed understanding of the importance of proper follow up care.   Clent Demark Macintyre Alexa M.D. Diseases & Surgery of the Retina and Vitreous Retina & Diabetic [place] 10/27/21     Abbreviations: M myopia (nearsighted); A astigmatism; H hyperopia (farsighted); P presbyopia; Mrx spectacle prescription;  CTL contact lenses; OD right eye; OS left eye; OU both eyes  XT exotropia; ET esotropia; PEK punctate epithelial keratitis; PEE punctate epithelial erosions; DES dry eye syndrome; MGD meibomian gland dysfunction; ATs artificial tears; PFAT's preservative free artificial tears; [place] nuclear sclerotic cataract; PSC posterior subcapsular cataract; ERM epi-retinal membrane; PVD posterior vitreous  detachment; RD retinal detachment; DM diabetes mellitus; DR diabetic retinopathy; NPDR non-proliferative diabetic retinopathy; PDR proliferative diabetic retinopathy; CSME clinically significant macular edema; DME diabetic macular edema; dbh dot blot hemorrhages; CWS cotton wool spot; POAG primary open angle glaucoma; C/D cup-to-disc ratio; HVF humphrey visual field; GVF goldmann visual field; OCT optical coherence tomography; IOP intraocular pressure; BRVO Branch retinal vein occlusion; CRVO central retinal vein occlusion; CRAO central retinal artery occlusion; BRAO branch retinal artery occlusion; RT retinal tear; SB scleral buckle; PPV pars plana vitrectomy; VH Vitreous hemorrhage; PRP panretinal laser photocoagulation; IVK intravitreal kenalog; VMT vitreomacular traction; MH Macular hole;  NVD neovascularization of the disc; NVE neovascularization elsewhere; AREDS age related eye disease study; ARMD age related macular degeneration; POAG primary open angle glaucoma; EBMD epithelial/anterior basement membrane dystrophy; ACIOL anterior chamber intraocular lens; IOL intraocular lens; PCIOL posterior chamber intraocular lens; Phaco/IOL phacoemulsification with intraocular lens placement; [place] photorefractive keratectomy; LASIK laser assisted in situ keratomileusis; HTN hypertension; DM diabetes mellitus; COPD chronic obstructive pulmonary disease

## 2021-10-27 NOTE — Assessment & Plan Note (Signed)
OD, improved macular anatomy yet with some foveal atrophy and outer retinal drusen.  Old PED nasal to the FAZ, no signs of active leakages no signs of active intraretinal fluid OD.  We will repeat injection today at 7-week and maintain 7-week interval at this time.  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

## 2021-11-10 DIAGNOSIS — R972 Elevated prostate specific antigen [PSA]: Secondary | ICD-10-CM | POA: Diagnosis not present

## 2021-11-17 DIAGNOSIS — R3914 Feeling of incomplete bladder emptying: Secondary | ICD-10-CM | POA: Diagnosis not present

## 2021-11-17 DIAGNOSIS — R972 Elevated prostate specific antigen [PSA]: Secondary | ICD-10-CM | POA: Diagnosis not present

## 2021-11-17 DIAGNOSIS — R351 Nocturia: Secondary | ICD-10-CM | POA: Diagnosis not present

## 2021-11-17 DIAGNOSIS — N401 Enlarged prostate with lower urinary tract symptoms: Secondary | ICD-10-CM | POA: Diagnosis not present

## 2021-11-17 DIAGNOSIS — R8271 Bacteriuria: Secondary | ICD-10-CM | POA: Diagnosis not present

## 2021-11-25 ENCOUNTER — Encounter (INDEPENDENT_AMBULATORY_CARE_PROVIDER_SITE_OTHER): Payer: Self-pay

## 2021-11-25 DIAGNOSIS — H401122 Primary open-angle glaucoma, left eye, moderate stage: Secondary | ICD-10-CM | POA: Diagnosis not present

## 2021-11-25 DIAGNOSIS — H40021 Open angle with borderline findings, high risk, right eye: Secondary | ICD-10-CM | POA: Diagnosis not present

## 2021-11-25 DIAGNOSIS — Z961 Presence of intraocular lens: Secondary | ICD-10-CM | POA: Diagnosis not present

## 2021-11-25 DIAGNOSIS — H353230 Exudative age-related macular degeneration, bilateral, stage unspecified: Secondary | ICD-10-CM | POA: Diagnosis not present

## 2021-11-25 DIAGNOSIS — H43811 Vitreous degeneration, right eye: Secondary | ICD-10-CM | POA: Diagnosis not present

## 2021-12-21 ENCOUNTER — Encounter (INDEPENDENT_AMBULATORY_CARE_PROVIDER_SITE_OTHER): Payer: Medicare Other | Admitting: Ophthalmology

## 2021-12-21 DIAGNOSIS — H35033 Hypertensive retinopathy, bilateral: Secondary | ICD-10-CM | POA: Diagnosis not present

## 2021-12-21 DIAGNOSIS — H401131 Primary open-angle glaucoma, bilateral, mild stage: Secondary | ICD-10-CM | POA: Diagnosis not present

## 2021-12-21 DIAGNOSIS — H353211 Exudative age-related macular degeneration, right eye, with active choroidal neovascularization: Secondary | ICD-10-CM | POA: Diagnosis not present

## 2021-12-21 DIAGNOSIS — H353122 Nonexudative age-related macular degeneration, left eye, intermediate dry stage: Secondary | ICD-10-CM | POA: Diagnosis not present

## 2021-12-21 DIAGNOSIS — H43813 Vitreous degeneration, bilateral: Secondary | ICD-10-CM | POA: Diagnosis not present

## 2022-02-08 DIAGNOSIS — H353122 Nonexudative age-related macular degeneration, left eye, intermediate dry stage: Secondary | ICD-10-CM | POA: Diagnosis not present

## 2022-02-08 DIAGNOSIS — H353211 Exudative age-related macular degeneration, right eye, with active choroidal neovascularization: Secondary | ICD-10-CM | POA: Diagnosis not present

## 2022-03-16 ENCOUNTER — Encounter (INDEPENDENT_AMBULATORY_CARE_PROVIDER_SITE_OTHER): Payer: Self-pay | Admitting: *Deleted

## 2022-03-31 DIAGNOSIS — L821 Other seborrheic keratosis: Secondary | ICD-10-CM | POA: Diagnosis not present

## 2022-03-31 DIAGNOSIS — L570 Actinic keratosis: Secondary | ICD-10-CM | POA: Diagnosis not present

## 2022-03-31 DIAGNOSIS — D045 Carcinoma in situ of skin of trunk: Secondary | ICD-10-CM | POA: Diagnosis not present

## 2022-03-31 DIAGNOSIS — L578 Other skin changes due to chronic exposure to nonionizing radiation: Secondary | ICD-10-CM | POA: Diagnosis not present

## 2022-03-31 DIAGNOSIS — Z85828 Personal history of other malignant neoplasm of skin: Secondary | ICD-10-CM | POA: Diagnosis not present

## 2022-04-05 DIAGNOSIS — D485 Neoplasm of uncertain behavior of skin: Secondary | ICD-10-CM | POA: Diagnosis not present

## 2022-04-12 ENCOUNTER — Encounter (INDEPENDENT_AMBULATORY_CARE_PROVIDER_SITE_OTHER): Payer: Self-pay

## 2022-04-12 DIAGNOSIS — H353211 Exudative age-related macular degeneration, right eye, with active choroidal neovascularization: Secondary | ICD-10-CM | POA: Diagnosis not present

## 2022-04-19 DIAGNOSIS — H40021 Open angle with borderline findings, high risk, right eye: Secondary | ICD-10-CM | POA: Diagnosis not present

## 2022-04-19 DIAGNOSIS — H401122 Primary open-angle glaucoma, left eye, moderate stage: Secondary | ICD-10-CM | POA: Diagnosis not present

## 2022-04-19 DIAGNOSIS — H353211 Exudative age-related macular degeneration, right eye, with active choroidal neovascularization: Secondary | ICD-10-CM | POA: Diagnosis not present

## 2022-04-19 DIAGNOSIS — H43811 Vitreous degeneration, right eye: Secondary | ICD-10-CM | POA: Diagnosis not present

## 2022-04-19 DIAGNOSIS — H353121 Nonexudative age-related macular degeneration, left eye, early dry stage: Secondary | ICD-10-CM | POA: Diagnosis not present

## 2022-04-19 DIAGNOSIS — Z961 Presence of intraocular lens: Secondary | ICD-10-CM | POA: Diagnosis not present

## 2022-04-22 ENCOUNTER — Encounter (INDEPENDENT_AMBULATORY_CARE_PROVIDER_SITE_OTHER): Payer: Self-pay

## 2022-04-22 ENCOUNTER — Telehealth (INDEPENDENT_AMBULATORY_CARE_PROVIDER_SITE_OTHER): Payer: Self-pay

## 2022-04-22 ENCOUNTER — Other Ambulatory Visit (INDEPENDENT_AMBULATORY_CARE_PROVIDER_SITE_OTHER): Payer: Self-pay

## 2022-04-22 DIAGNOSIS — Z86010 Personal history of colonic polyps: Secondary | ICD-10-CM

## 2022-04-22 MED ORDER — PEG 3350-KCL-NA BICARB-NACL 420 G PO SOLR: 4000.0000 mL | ORAL | 0 refills | Status: DC

## 2022-04-22 NOTE — Telephone Encounter (Signed)
Referring MD/PCP: [facility]  Procedure: tcs   Reason/Indication:  hx of colon polyps  Has patient had this procedure before?  Yes 04/21/17  If so, when, by whom and where?    Is there a family history of colon cancer?  No   Who?  What age when diagnosed?    Is patient diabetic? If yes, Type 1 or Type 2   no      Does patient have prosthetic heart valve or mechanical valve?  no  Do you have a pacemaker/defibrillator?  no  Has patient ever had endocarditis/atrial fibrillation? no  Does patient use oxygen? no  Has patient had joint replacement within last 12 months?  no  Is patient constipated or do they take laxatives? no  Does patient have a history of alcohol/drug use?  no  Have you had a stroke/heart attack last 6 mths? no  Do you take medicine for weight loss?  no  For male patients,: have you had a hysterectomy n/a                       are you post menopausal n/a                      do you still have your menstrual cycle n/a  Is patient on blood thinner such as Coumadin, Plavix and/or Aspirin? no  Medications: none  Allergies: nkda  Medication Adjustment per Dr Laural Golden: none  Procedure date & time: 05/05/22 at 120

## 2022-04-22 NOTE — Telephone Encounter (Signed)
Dava Rensch Ann Adil Tugwell, CMA  ?

## 2022-04-22 NOTE — Telephone Encounter (Signed)
Anas Reister Ann Jasmond River, CMA  ?

## 2022-05-03 ENCOUNTER — Encounter (HOSPITAL_COMMUNITY)
Admission: RE | Admit: 2022-05-03 | Discharge: 2022-05-03 | Disposition: A | Discharge status: Home / Self Care | Payer: Medicare Other | Source: Ambulatory Visit | Attending: Internal Medicine | Admitting: Internal Medicine

## 2022-05-05 ENCOUNTER — Encounter (HOSPITAL_COMMUNITY): Payer: Self-pay | Admitting: Internal Medicine

## 2022-05-05 ENCOUNTER — Ambulatory Visit (HOSPITAL_BASED_OUTPATIENT_CLINIC_OR_DEPARTMENT_OTHER): Payer: Medicare Other | Admitting: Anesthesiology

## 2022-05-05 ENCOUNTER — Encounter (HOSPITAL_COMMUNITY)
Admission: RE | Disposition: A | Discharge status: Home / Self Care | Payer: Self-pay | Source: Home / Self Care | Attending: Internal Medicine

## 2022-05-05 ENCOUNTER — Ambulatory Visit (HOSPITAL_COMMUNITY)
Admission: RE | Admit: 2022-05-05 | Discharge: 2022-05-05 | Disposition: A | Discharge status: Home / Self Care | Payer: Medicare Other | Attending: Internal Medicine | Admitting: Internal Medicine

## 2022-05-05 ENCOUNTER — Encounter (INDEPENDENT_AMBULATORY_CARE_PROVIDER_SITE_OTHER): Payer: Self-pay | Admitting: *Deleted

## 2022-05-05 ENCOUNTER — Ambulatory Visit (HOSPITAL_COMMUNITY): Payer: Medicare Other | Admitting: Anesthesiology

## 2022-05-05 DIAGNOSIS — D120 Benign neoplasm of cecum: Secondary | ICD-10-CM | POA: Insufficient documentation

## 2022-05-05 DIAGNOSIS — K5730 Diverticulosis of large intestine without perforation or abscess without bleeding: Secondary | ICD-10-CM

## 2022-05-05 DIAGNOSIS — Z86010 Personal history of colonic polyps: Secondary | ICD-10-CM | POA: Insufficient documentation

## 2022-05-05 DIAGNOSIS — K644 Residual hemorrhoidal skin tags: Secondary | ICD-10-CM | POA: Diagnosis not present

## 2022-05-05 DIAGNOSIS — D123 Benign neoplasm of transverse colon: Secondary | ICD-10-CM | POA: Diagnosis not present

## 2022-05-05 DIAGNOSIS — Z1211 Encounter for screening for malignant neoplasm of colon: Secondary | ICD-10-CM | POA: Insufficient documentation

## 2022-05-05 DIAGNOSIS — K635 Polyp of colon: Secondary | ICD-10-CM | POA: Diagnosis not present

## 2022-05-05 DIAGNOSIS — D122 Benign neoplasm of ascending colon: Secondary | ICD-10-CM | POA: Diagnosis not present

## 2022-05-05 DIAGNOSIS — Z09 Encounter for follow-up examination after completed treatment for conditions other than malignant neoplasm: Secondary | ICD-10-CM | POA: Diagnosis not present

## 2022-05-05 HISTORY — PX: COLONOSCOPY WITH PROPOFOL: SHX5780

## 2022-05-05 HISTORY — PX: POLYPECTOMY: SHX149

## 2022-05-05 LAB — HM COLONOSCOPY

## 2022-05-05 SURGERY — COLONOSCOPY WITH PROPOFOL
Anesthesia: General

## 2022-05-05 MED ORDER — LIDOCAINE HCL (CARDIAC) PF 50 MG/5ML IV SOSY
PREFILLED_SYRINGE | INTRAVENOUS | Status: DC | PRN
  Administered 2022-05-05: 50 mg via INTRAVENOUS

## 2022-05-05 MED ORDER — LACTATED RINGERS IV SOLN: INTRAVENOUS | Status: DC

## 2022-05-05 MED ORDER — PROPOFOL 500 MG/50ML IV EMUL
INTRAVENOUS | Status: DC | PRN
  Administered 2022-05-05: 100 ug/kg/min via INTRAVENOUS

## 2022-05-05 MED ORDER — PROPOFOL 10 MG/ML IV BOLUS
INTRAVENOUS | Status: DC | PRN
  Administered 2022-05-05: 110 mg via INTRAVENOUS

## 2022-05-05 NOTE — Transfer of Care (Signed)
Immediate Anesthesia Transfer of Care Note  Patient: Erik Ware  Procedure(s) Performed: COLONOSCOPY WITH PROPOFOL POLYPECTOMY INTESTINAL  Patient Location: Short Stay  Anesthesia Type:General  Level of Consciousness: awake and patient cooperative  Airway & Oxygen Therapy: Patient Spontanous Breathing  Post-op Assessment: Report given to RN and Post -op Vital signs reviewed and stable  Post vital signs: Reviewed and stable  Last Vitals:  Vitals Value Taken Time  BP 119/80 05/05/22 1421  Temp 36.3 C 05/05/22 1421  Pulse 68 05/05/22 1421  Resp 24 05/05/22 1421  SpO2 99 % 05/05/22 1421    Last Pain:  Vitals:   05/05/22 1421  TempSrc: Oral  PainSc: Asleep      Patients Stated Pain Goal: 5 (16/10/96 0454)  Complications: No notable events documented.

## 2022-05-05 NOTE — Anesthesia Procedure Notes (Signed)
Date/Time: 05/05/2022 1:41 PM Performed by: Vista Deck, CRNA Pre-anesthesia Checklist: Patient identified, Emergency Drugs available, Suction available, Timeout performed and Patient being monitored Patient Re-evaluated:Patient Re-evaluated prior to induction Oxygen Delivery Method: Nasal Cannula

## 2022-05-05 NOTE — Discharge Instructions (Signed)
No aspirin or NSAIDs for 24 hours Resume usual medications as before High-fiber diet. No driving for 24 hours.

## 2022-05-05 NOTE — H&P (Signed)
Erik Ware is an 76 y.o. male.   Chief Complaint: Patient is here for colonoscopy HPI: Patient is 76 year old Caucasian male who has history of colonic polyps and is here for surveillance examination.  He denies abdominal pain change in bowel habits or rectal bleeding.  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  Last exam was in May 2018 with removal of 3 small cecal polyps and these were tubular adenomas. Family history is negative for colon cancer. Patient does not take aspirin or anticoagulants.   Past Medical History:  Diagnosis Date   Medical history non-contributory    Nuclear sclerotic cataract of left eye 04/22/2021   Nuclear sclerotic cataract of right eye 04/22/2021   Under the care of Dr. Carolynn Sayers    Past Surgical History:  Procedure Laterality Date   APPENDECTOMY     BACK SURGERY     COLONOSCOPY N/A 12/06/2013   Procedure: COLONOSCOPY;  Surgeon: Rogene Houston, MD;  [facility];  Service: Endoscopy;  Laterality: N/A;  830   COLONOSCOPY N/A 04/21/2017   Procedure: COLONOSCOPY;  Surgeon: Rogene Houston, MD;  [facility];  Service: Endoscopy;  Laterality: N/A;  1200   HERNIA REPAIR     KNEE ARTHROSCOPY Right    Left hip pinning     POLYPECTOMY  04/21/2017   Procedure: POLYPECTOMY;  Surgeon: Rogene Houston, MD;  [facility];  Service: Endoscopy;;  cecal x3    Family History  Problem Relation Age of Onset   Thyroid cancer Mother    Breast cancer Mother    Prostate cancer Father    Colon cancer Neg Hx    Social History:  reports that he has never smoked. He has never used smokeless tobacco. He reports current alcohol use of about 2.0 standard drinks per week. He reports that he does not use drugs.  Allergies: No Known Allergies  Medications Prior to Admission  Medication Sig Dispense Refill   latanoprost (XALATAN) 0.005 % ophthalmic solution Place 1 drop into both eyes at bedtime.     Multiple Vitamins-Minerals  (PRESERVISION AREDS 2 PO) Take 1 capsule by mouth 2 (two) times daily.     polyethylene glycol-electrolytes (TRILYTE) 420 g solution Take 4,000 mLs by mouth as directed. 4000 mL 0    No results found for this or any previous visit (from the past 48 hour(s)). No results found.  Review of Systems  Blood pressure (!) 151/80, pulse 68, temperature 97.7 F (36.5 C), temperature source Oral, resp. rate 18, SpO2 99 %. Physical Exam HENT:     Mouth/Throat:     Mouth: Mucous membranes are moist.     Pharynx: Oropharynx is clear.  Eyes:     General: No scleral icterus.    Conjunctiva/sclera: Conjunctivae normal.  Cardiovascular:     Rate and Rhythm: Normal rate and regular rhythm.     Heart sounds: Normal heart sounds. No murmur heard. Pulmonary:     Effort: Pulmonary effort is normal.     Breath sounds: Normal breath sounds.  Abdominal:     General: There is no distension.     Palpations: Abdomen is soft. There is no mass.     Tenderness: There is no abdominal tenderness.  Musculoskeletal:        General: No swelling.     Cervical back: Neck supple.  Lymphadenopathy:     Cervical: No cervical adenopathy.  Skin:    General: Skin is warm  and dry.  Neurological:     Mental Status: He is alert.     Assessment/Plan  History of colonic adenomas. Surveillance colonoscopy.  [name], MD 05/05/2022, 1:31 PM

## 2022-05-05 NOTE — Op Note (Signed)
[facility] Patient Name: Erik Ware Procedure Date: 05/05/2022 1:23 PM MRN: [id number] Date of Birth: 1946-03-08 Attending MD: [name] , MD CSN: [id number] Age: 76 Admit Type: Outpatient Procedure:                Colonoscopy Indications:              High risk colon cancer surveillance: Personal                            history of colonic polyps Providers:                [name], MD, Crystal Page, Rosina Lowenstein, RN Referring MD:             Monico Blitz, MD Medicines:                Propofol per Anesthesia Complications:            No immediate complications. Estimated Blood Loss:     Estimated blood loss was minimal. Procedure:                Pre-Anesthesia Assessment:                           - Prior to the procedure, a History and Physical                            was performed, and patient medications and                            allergies were reviewed. The patient's tolerance of                            previous anesthesia was also reviewed. The risks                            and benefits of the procedure and the sedation                            options and risks were discussed with the patient.                            All questions were answered, and informed consent                            was obtained. Prior Anticoagulants: The patient has                            taken no previous anticoagulant or antiplatelet                            agents except for aspirin. ASA Grade Assessment: I                            - A normal, healthy patient. After reviewing the  risks and benefits, the patient was deemed in                            satisfactory condition to undergo the procedure.                           After obtaining informed consent, the colonoscope                            was passed under direct vision. Throughout the                            procedure, the patient's blood pressure, pulse, and                             oxygen saturations were monitored continuously. The                            [id number] ([id number]) scope was introduced through                            the anus and advanced to the the cecum, identified                            by appendiceal orifice and ileocecal valve. The                            colonoscopy was performed without difficulty. The                            patient tolerated the procedure well. The quality                            of the bowel preparation was good. The ileocecal                            valve, appendiceal orifice, and rectum were                            photographed. Scope In: 1:45:47 PM Scope Out: 2:15:01 PM Scope Withdrawal Time: 0 hours 22 minutes 4 seconds  Total Procedure Duration: 0 hours 29 minutes 14 seconds  Findings:      The perianal and digital rectal examinations were normal.      Five polyps were found in the transverse colon, hepatic flexure,       ascending colon and cecum. The polyps were small in size. These were       biopsied with a cold forceps for histology. The pathology specimen was       placed into Bottle Number 1.      Four polyps were found in the transverse colon, hepatic flexure and       cecum. The polyps were small in size. These polyps were removed with a       cold snare. Resection and retrieval were complete. The pathology  specimen was placed into Bottle Number 1.      A 4 to 8 mm polyp was found in the hepatic flexure. The polyp was flat.       The polyp was removed with a piecemeal technique using a cold snare.       Resection and retrieval were complete. The pathology specimen was placed       into Bottle Number 2.      Scattered diverticula were found in the sigmoid colon.      External hemorrhoids were found during retroflexion. The hemorrhoids       were small. Impression:               - Five small polyps in the transverse colon, at the                             hepatic flexure, in the ascending colon and in the                            cecum. Biopsied.                           - Four small polyps in the transverse colon, at the                            hepatic flexure and in the cecum, removed with a                            cold snare. Resected and retrieved.                           - One 4 to 8 mm polyp at the hepatic flexure,                            removed piecemeal using a cold snare. Resected and                            retrieved.                           - Diverticulosis in the sigmoid colon.                           - External hemorrhoids. Moderate Sedation:      Per Anesthesia Care Recommendation:           - Patient has a contact number available for                            emergencies. The signs and symptoms of potential                            delayed complications were discussed with the                            patient. Return to normal activities tomorrow.  Written discharge instructions were provided to the                            patient.                           - High fiber diet today.                           - Continue present medications.                           - No aspirin, ibuprofen, naproxen, or other                            non-steroidal anti-inflammatory drugs for 1 day.                           - Await pathology results.                           - Repeat colonoscopy is recommended for                            surveillance. The colonoscopy date will be                            determined after pathology results from today's                            exam become available for review. Procedure Code(s):        --- Professional ---                           [phone], Colonoscopy, flexible; with removal of                            tumor(s), polyp(s), or other lesion(s) by snare                            technique                           [id number], 43,  Colonoscopy, flexible; with biopsy,                            single or multiple Diagnosis Code(s):        --- Professional ---                           K63.5, Polyp of colon                           Z86.010, Personal history of colonic polyps                           K64.4, Residual hemorrhoidal skin tags  K57.30, Diverticulosis of large intestine without                            perforation or abscess without bleeding CPT copyright 2019 American Medical Association. All rights reserved. The codes documented in this report are preliminary and upon coder review may  be revised to meet current compliance requirements. [name], MD [name], MD 05/05/2022 2:25:59 PM This report has been signed electronically. Number of Addenda: 0

## 2022-05-05 NOTE — Anesthesia Preprocedure Evaluation (Signed)

## 2022-05-06 NOTE — Anesthesia Postprocedure Evaluation (Signed)
Anesthesia Post Note  Patient: Erik Ware  Procedure(s) Performed: COLONOSCOPY WITH PROPOFOL POLYPECTOMY INTESTINAL  Patient location during evaluation: Phase II Anesthesia Type: General Level of consciousness: awake Pain management: pain level controlled Vital Signs Assessment: post-procedure vital signs reviewed and stable Respiratory status: spontaneous breathing and respiratory function stable Cardiovascular status: blood pressure returned to baseline and stable Postop Assessment: no headache and no apparent nausea or vomiting Anesthetic complications: no Comments: Late entry   No notable events documented.   Last Vitals:  Vitals:   05/05/22 1216 05/05/22 1421  BP: (!) 151/80 119/80  Pulse: 68 68  Resp: 18 (!) 24  Temp: 36.5 C (!) 36.3 C  SpO2: 99% 99%    Last Pain:  Vitals:   05/05/22 1421  TempSrc: Oral  PainSc: [place]

## 2022-05-07 LAB — SURGICAL PATHOLOGY

## 2022-05-12 ENCOUNTER — Encounter (HOSPITAL_COMMUNITY): Payer: Self-pay | Admitting: Internal Medicine

## 2022-05-19 DIAGNOSIS — N401 Enlarged prostate with lower urinary tract symptoms: Secondary | ICD-10-CM | POA: Diagnosis not present

## 2022-05-19 DIAGNOSIS — R3914 Feeling of incomplete bladder emptying: Secondary | ICD-10-CM | POA: Diagnosis not present

## 2022-05-19 DIAGNOSIS — R8271 Bacteriuria: Secondary | ICD-10-CM | POA: Diagnosis not present

## 2022-05-24 DIAGNOSIS — Z1339 Encounter for screening examination for other mental health and behavioral disorders: Secondary | ICD-10-CM | POA: Diagnosis not present

## 2022-05-24 DIAGNOSIS — Z7189 Other specified counseling: Secondary | ICD-10-CM | POA: Diagnosis not present

## 2022-05-24 DIAGNOSIS — Z6822 Body mass index (BMI) 22.0-22.9, adult: Secondary | ICD-10-CM | POA: Diagnosis not present

## 2022-05-24 DIAGNOSIS — Z79899 Other long term (current) drug therapy: Secondary | ICD-10-CM | POA: Diagnosis not present

## 2022-05-24 DIAGNOSIS — R5383 Other fatigue: Secondary | ICD-10-CM | POA: Diagnosis not present

## 2022-05-24 DIAGNOSIS — Z789 Other specified health status: Secondary | ICD-10-CM | POA: Diagnosis not present

## 2022-05-24 DIAGNOSIS — Z1331 Encounter for screening for depression: Secondary | ICD-10-CM | POA: Diagnosis not present

## 2022-05-24 DIAGNOSIS — Z125 Encounter for screening for malignant neoplasm of prostate: Secondary | ICD-10-CM | POA: Diagnosis not present

## 2022-05-24 DIAGNOSIS — E7800 Pure hypercholesterolemia, unspecified: Secondary | ICD-10-CM | POA: Diagnosis not present

## 2022-05-24 DIAGNOSIS — Z0000 Encounter for general adult medical examination without abnormal findings: Secondary | ICD-10-CM | POA: Diagnosis not present

## 2022-05-24 DIAGNOSIS — Z299 Encounter for prophylactic measures, unspecified: Secondary | ICD-10-CM | POA: Diagnosis not present

## 2022-06-07 ENCOUNTER — Encounter (INDEPENDENT_AMBULATORY_CARE_PROVIDER_SITE_OTHER): Payer: Self-pay | Admitting: Ophthalmology

## 2022-06-07 ENCOUNTER — Ambulatory Visit (INDEPENDENT_AMBULATORY_CARE_PROVIDER_SITE_OTHER): Payer: Medicare Other | Admitting: Ophthalmology

## 2022-06-07 DIAGNOSIS — H353212 Exudative age-related macular degeneration, right eye, with inactive choroidal neovascularization: Secondary | ICD-10-CM | POA: Diagnosis not present

## 2022-06-07 DIAGNOSIS — H353211 Exudative age-related macular degeneration, right eye, with active choroidal neovascularization: Secondary | ICD-10-CM

## 2022-06-07 DIAGNOSIS — H353132 Nonexudative age-related macular degeneration, bilateral, intermediate dry stage: Secondary | ICD-10-CM

## 2022-06-07 MED ORDER — BEVACIZUMAB 2.5 MG/0.1ML IZ SOSY
2.5000 mg | PREFILLED_SYRINGE | INTRAVITREAL | Status: AC | PRN
  Administered 2022-06-07: 2.5 mg via INTRAVITREAL

## 2022-06-07 NOTE — Assessment & Plan Note (Signed)
No sign of CNVM OS by exam

## 2022-06-07 NOTE — Progress Notes (Signed)
06/07/2022     CHIEF COMPLAINT Patient presents for  Chief Complaint  Patient presents with   Macular Degeneration      HISTORY OF PRESENT ILLNESS: Erik Ware is a 76 y.o. male who presents to the clinic today for:   HPI   Treated in [place] with intravitreal Avastin for wet AMD, last visit there 7 weeks previous Last edited by Hurman Horn, MD on 06/07/2022 11:23 AM.      Referring physician: Monico Blitz, MD [place],  [place] [id number]  HISTORICAL INFORMATION:   Selected notes from the [facility]: Current Outpatient Medications (Ophthalmic Drugs)  Medication Sig   latanoprost (XALATAN) 0.005 % ophthalmic solution Place 1 drop into both eyes at bedtime.   No current facility-administered medications for this visit. (Ophthalmic Drugs)   Current Outpatient Medications (Other)  Medication Sig   Multiple Vitamins-Minerals (PRESERVISION AREDS 2 PO) Take 1 capsule by mouth 2 (two) times daily.   No current facility-administered medications for this visit. (Other)      REVIEW OF SYSTEMS: ROS   Negative for: Constitutional, Gastrointestinal, Neurological, Skin, Genitourinary, Musculoskeletal, HENT, Endocrine, Cardiovascular, Eyes, Respiratory, Psychiatric, Allergic/Imm, Heme/Lymph Last edited by Hurman Horn, MD on 06/07/2022 10:20 AM.       ALLERGIES No Known Allergies  PAST MEDICAL HISTORY Past Medical History:  Diagnosis Date   Medical history non-contributory    Nuclear sclerotic cataract of left eye 04/22/2021   Nuclear sclerotic cataract of right eye 04/22/2021   Under the care of Dr. Carolynn Sayers   Past Surgical History:  Procedure Laterality Date   APPENDECTOMY     BACK SURGERY     COLONOSCOPY N/A 12/06/2013   Procedure: COLONOSCOPY;  Surgeon: Rogene Houston, MD;  [facility];  Service: Endoscopy;  Laterality: N/A;  830   COLONOSCOPY N/A 04/21/2017   Procedure: COLONOSCOPY;  Surgeon: Rogene Houston, MD;  [facility];  Service: Endoscopy;  Laterality: N/A;  1200   COLONOSCOPY WITH PROPOFOL N/A 05/05/2022   Procedure: COLONOSCOPY WITH PROPOFOL;  Surgeon: Rogene Houston, MD;  [facility];  Service: Endoscopy;  Laterality: N/A;  120 ASA 1   HERNIA REPAIR     KNEE ARTHROSCOPY Right    Left hip pinning     POLYPECTOMY  04/21/2017   Procedure: POLYPECTOMY;  Surgeon: Rogene Houston, MD;  [facility];  Service: Endoscopy;;  cecal x3   POLYPECTOMY  05/05/2022   Procedure: POLYPECTOMY INTESTINAL;  Surgeon: Rogene Houston, MD;  [facility];  Service: Endoscopy;;    FAMILY HISTORY Family History  Problem Relation Age of Onset   Thyroid cancer Mother    Breast cancer Mother    Prostate cancer Father    Colon cancer Neg Hx     SOCIAL HISTORY Social History   Tobacco Use   Smoking status: Never   Smokeless tobacco: Never  Vaping Use   Vaping Use: Never used  Substance Use Topics   Alcohol use: Yes    Alcohol/week: 2.0 standard drinks of alcohol    Types: 2 Standard drinks or equivalent per week    Comment: brandy - maybe 2 drinks per week   Drug use: No         OPHTHALMIC EXAM:  Base Eye Exam     Visual Acuity (ETDRS)       Right Left  Dist [place] 20/20 -1 20/20         Tonometry (Tonopen, 10:24 AM)       Right Left   Pressure 11 11         Neuro/Psych     Oriented x3: Yes   Mood/Affect: Normal         Dilation     Both eyes: 1.0% Mydriacyl, 2.5% Phenylephrine @ 10:22 AM           Slit Lamp and Fundus Exam     External Exam       Right Left   External Normal Normal         Slit Lamp Exam       Right Left   Lids/Lashes Normal Normal   Conjunctiva/Sclera White and quiet White and quiet   Cornea Clear Clear   Anterior Chamber Deep and quiet Deep and quiet   Iris Round and reactive Round and reactive   Lens Centered posterior chamber intraocular lens Posterior chamber intraocular  lens   Anterior Vitreous Normal Normal         Fundus Exam       Right Left   Posterior Vitreous Posterior vitreous detachment Normal   Disc Normal Normal color, pink, yet there is vertical of allergy to the cup.  This might suggest early glaucomatous change.   C/D Ratio 0.6 0.65   Macula Retinal pigment epithelial mottling, no macular thickening, no hemorrhage, no exudates, no fluid seen clinically Hard drusen, Retinal pigment epithelial mottling, no macular thickening, no hemorrhage   Vessels Normal Normal   Periphery Normal Normal            IMAGING AND PROCEDURES  Imaging and Procedures for 06/07/22  OCT, Retina - OU - Both Eyes       Right Eye Quality was good. Scan locations included subfoveal. Central Foveal Thickness: 215. Progression has been stable. Findings include abnormal foveal contour, retinal drusen , pigment epithelial detachment, central retinal atrophy, outer retinal atrophy.   Left Eye Quality was good. Scan locations included subfoveal. Central Foveal Thickness: 257. Progression has been stable. Findings include abnormal foveal contour, retinal drusen , central retinal atrophy, outer retinal atrophy.   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  Post treatment in [place].  Repeat injection today we will treat and extend OD next          Intravitreal Injection, Pharmacologic Agent - OD - Right Eye       Time Out 06/07/2022. 10:25 AM. Confirmed correct patient, procedure, site, and patient consented.   Anesthesia Topical anesthesia was used. Anesthetic medications included Lidocaine 4%.   Procedure Preparation included 5% betadine to ocular surface, 10% betadine to eyelids, Tobramycin 0.3%, Ofloxacin . A 30 gauge needle was used.   Injection: 2.5 mg bevacizumab 2.5 MG/0.1ML   Route: Intravitreal, Site: Right Eye   NDC: [phone], Lot:  [id number], Expiration date: 07/30/2022   Post-op Post injection exam found visual acuity of at least counting fingers. The patient tolerated the procedure well. There were no complications. The patient received written and verbal post procedure care education. Post injection medications included ocuflox.              ASSESSMENT/PLAN:  Exudative age-related macular degeneration of right eye with active choroidal neovascularization (HCC) OD, vascularized PED remained stable while in the [place] and treated there at 7-week interval.  We will repeat  injection today and we will extend interval examination next 8 weeks  Intermediate stage nonexudative age-related macular degeneration of both eyes No sign of CNVM OS by exam     ICD-10-CM   1. Exudative age-related macular degeneration of right eye with active choroidal neovascularization (HCC)  H35.3211 Intravitreal Injection, Pharmacologic Agent - OD - Right Eye    bevacizumab (AVASTIN) SOSY 2.5 mg    2. Exudative age-related macular degeneration of right eye with inactive choroidal neovascularization (HCC)  H35.3212 OCT, Retina - OU - Both Eyes    3. Intermediate stage nonexudative age-related macular degeneration of both eyes  H35.3132 OCT, Retina - OU - Both Eyes      1.  OD with stable acuity currently at 7-week follow-up post return from [place] where he was treated for wet AMD OD.  We will repeat injection today and reevaluate next in 8-week  2.  3.  Ophthalmic Meds Ordered this visit:  Meds ordered this encounter  Medications   bevacizumab (AVASTIN) SOSY 2.5 mg       Return in about 8 weeks (around 08/02/2022) for dilate, OD, AVASTIN OCT.  Patient Instructions  Return as needed new onset distortion visual acuity changes in right or left eye   Explained the diagnoses, plan, and follow up with the patient and they expressed understanding.  Patient expressed understanding of the importance of proper follow up care.   Clent Demark Inetta Dicke M.D. Diseases & Surgery of the Retina and Vitreous [facility]ina [facility] [place] 06/07/22     Abbreviations: M myopia (nearsighted); A astigmatism; H hyperopia (farsighted); P presbyopia; Mrx spectacle prescription;  CTL contact lenses; OD right eye; OS left eye; OU both eyes  XT exotropia; ET esotropia; PEK punctate epithelial keratitis; PEE punctate epithelial erosions; DES dry eye syndrome; MGD meibomian gland dysfunction; ATs artificial tears; PFAT's preservative free artificial tears; [place] nuclear sclerotic cataract; PSC posterior subcapsular cataract; ERM epi-retinal membrane; PVD posterior vitreous detachment; RD retinal detachment; DM diabetes mellitus; DR diabetic retinopathy; NPDR non-proliferative diabetic retinopathy; PDR proliferative diabetic retinopathy; CSME clinically significant macular edema; DME diabetic macular edema; dbh dot blot hemorrhages; CWS cotton wool spot; POAG primary open angle glaucoma; C/D cup-to-disc ratio; HVF humphrey visual field; GVF goldmann visual field; OCT optical coherence tomography; IOP intraocular pressure; BRVO Branch retinal vein occlusion; CRVO central retinal vein occlusion; CRAO central retinal artery occlusion; BRAO branch retinal artery occlusion; RT retinal tear; SB scleral buckle; PPV pars plana vitrectomy; VH Vitreous hemorrhage; PRP panretinal laser photocoagulation; IVK intravitreal kenalog; VMT vitreomacular traction; MH Macular hole;  NVD neovascularization of the disc; NVE neovascularization elsewhere; AREDS age related eye disease study; ARMD age related macular degeneration; POAG primary open angle glaucoma; EBMD epithelial/anterior basement membrane dystrophy; ACIOL anterior chamber intraocular lens; IOL intraocular lens; PCIOL posterior chamber intraocular lens; Phaco/IOL phacoemulsification with intraocular lens placement; [place] photorefractive keratectomy; LASIK laser assisted in situ keratomileusis; HTN hypertension; DM diabetes  mellitus; COPD chronic obstructive pulmonary disease

## 2022-06-07 NOTE — Assessment & Plan Note (Signed)
OD, vascularized PED remained stable while in the [place] and treated there at 7-week interval.  We will repeat injection today and we will extend interval examination next 8 weeks

## 2022-06-07 NOTE — Patient Instructions (Signed)
Return as needed new onset distortion visual acuity changes in right or left eye

## 2022-08-03 ENCOUNTER — Ambulatory Visit (INDEPENDENT_AMBULATORY_CARE_PROVIDER_SITE_OTHER): Payer: Medicare Other | Admitting: Ophthalmology

## 2022-08-03 ENCOUNTER — Encounter (INDEPENDENT_AMBULATORY_CARE_PROVIDER_SITE_OTHER): Payer: Self-pay | Admitting: Ophthalmology

## 2022-08-03 DIAGNOSIS — H353212 Exudative age-related macular degeneration, right eye, with inactive choroidal neovascularization: Secondary | ICD-10-CM

## 2022-08-03 DIAGNOSIS — H353211 Exudative age-related macular degeneration, right eye, with active choroidal neovascularization: Secondary | ICD-10-CM | POA: Diagnosis not present

## 2022-08-03 DIAGNOSIS — H353132 Nonexudative age-related macular degeneration, bilateral, intermediate dry stage: Secondary | ICD-10-CM

## 2022-08-03 MED ORDER — BEVACIZUMAB CHEMO INJECTION 1.25MG/0.05ML SYRINGE FOR KALEIDOSCOPE
1.2500 mg | INTRAVITREAL | Status: AC | PRN
  Administered 2022-08-03: 1.25 mg via INTRAVITREAL

## 2022-08-03 NOTE — Progress Notes (Signed)
08/03/2022     CHIEF COMPLAINT Patient presents for  Chief Complaint  Patient presents with   Macular Degeneration      HISTORY OF PRESENT ILLNESS: Erik Ware is a 76 y.o. 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  Controlled again today at 8-week interval by visual acuity findings 8 weeks dilate od avastin oct Pt states his vision has been stable Pt denies any new floaters or FOL   Last edited by Hurman Horn, MD on 08/03/2022 10:26 AM.      Referring physician: Monico Blitz, MD [place],  [place] [id number]  HISTORICAL INFORMATION:   Selected notes from the MEDICAL RECORD NUMBER       CURRENT MEDICATIONS: Current Outpatient Medications (Ophthalmic Drugs)  Medication Sig   latanoprost (XALATAN) 0.005 % ophthalmic solution Place 1 drop into both eyes at bedtime.   No current facility-administered medications for this visit. (Ophthalmic Drugs)   Current Outpatient Medications (Other)  Medication Sig   Multiple Vitamins-Minerals (PRESERVISION AREDS 2 PO) Take 1 capsule by mouth 2 (two) times daily.   No current facility-administered medications for this visit. (Other)      REVIEW OF SYSTEMS: ROS   Negative for: Constitutional, Gastrointestinal, Neurological, Skin, Genitourinary, Musculoskeletal, HENT, Endocrine, Cardiovascular, Eyes, Respiratory, Psychiatric, Allergic/Imm, Heme/Lymph Last edited by Orene Desanctis D, CMA on 08/03/2022 10:00 AM.       ALLERGIES No Known Allergies  PAST MEDICAL HISTORY Past Medical History:  Diagnosis Date   Medical history non-contributory    Nuclear sclerotic cataract of left eye 04/22/2021   Nuclear sclerotic cataract of right eye 04/22/2021   Under the care of Dr. Carolynn Sayers   Past Surgical History:  Procedure Laterality Date   APPENDECTOMY     BACK SURGERY     COLONOSCOPY N/A 12/06/2013   Procedure: COLONOSCOPY;  Surgeon: Rogene Houston, MD;  [facility]: [facility];  Service: Endoscopy;  Laterality: N/A;  830   COLONOSCOPY N/A 04/21/2017   Procedure: COLONOSCOPY;  Surgeon: Rogene Houston, MD;  Location: [facility];  Service: Endoscopy;  Laterality: N/A;  1200   COLONOSCOPY WITH PROPOFOL N/A 05/05/2022   Procedure: COLONOSCOPY WITH PROPOFOL;  Surgeon: Rogene Houston, MD;  Location: [facility];  Service: Endoscopy;  Laterality: N/A;  120 ASA 1   HERNIA REPAIR     KNEE ARTHROSCOPY Right    Left hip pinning     POLYPECTOMY  04/21/2017   Procedure: POLYPECTOMY;  Surgeon: Rogene Houston, MD;  Location: [facility];  Service: Endoscopy;;  cecal x3   POLYPECTOMY  05/05/2022   Procedure: POLYPECTOMY INTESTINAL;  Surgeon: Rogene Houston, MD;  Location: [facility];  Service: Endoscopy;;    FAMILY HISTORY Family History  Problem Relation Age of Onset   Thyroid cancer Mother    Breast cancer Mother    Prostate cancer Father    Colon cancer Neg Hx     SOCIAL HISTORY Social History   Tobacco Use   Smoking status: Never   Smokeless tobacco: Never  Vaping Use   Vaping Use: Never used  Substance Use Topics   Alcohol use: Yes    Alcohol/week: 2.0 standard drinks of alcohol    Types: 2 Standard drinks or equivalent per week    Comment: brandy - maybe 2 drinks per week   Drug use: No  OPHTHALMIC EXAM:  Base Eye Exam     Visual Acuity (ETDRS)       Right Left   Dist [place] 20/25 +1 20/20         Tonometry (Tonopen, 10:04 AM)       Right Left   Pressure 8 8         Visual Fields       Left Right    Full Full         Extraocular Movement       Right Left    Ortho Ortho    -- -- --  --  --  -- -- --   -- -- --  --  --  -- -- --           Neuro/Psych     Oriented x3: Yes   Mood/Affect: Normal         Dilation     Right eye: 1.0% Mydriacyl, 2.5% Phenylephrine @ 10:01 AM           Slit Lamp and Fundus Exam     External Exam       Right Left   External Normal Normal          Slit Lamp Exam       Right Left   Lids/Lashes Normal Normal   Conjunctiva/Sclera White and quiet White and quiet   Cornea Clear Clear   Anterior Chamber Deep and quiet Deep and quiet   Iris Round and reactive Round and reactive   Lens Centered posterior chamber intraocular lens Posterior chamber intraocular lens   Anterior Vitreous Normal Normal         Fundus Exam       Right Left   Posterior Vitreous Posterior vitreous detachment    Disc Normal    C/D Ratio 0.6    Macula Retinal pigment epithelial mottling, no macular thickening, no hemorrhage, no exudates, no fluid seen clinically    Vessels Normal    Periphery Normal             IMAGING AND PROCEDURES  Imaging and Procedures for 08/03/22  OCT, Retina - OU - Both Eyes       Right Eye Quality was good. Scan locations included subfoveal. Central Foveal Thickness: 209. Progression has been stable. Findings include abnormal foveal contour, retinal drusen , pigment epithelial detachment, central retinal atrophy, outer retinal atrophy.   Left Eye Quality was good. Scan locations included subfoveal. Central Foveal Thickness: 249. Progression has been stable. Findings include abnormal foveal contour, retinal drusen , central retinal atrophy, outer retinal atrophy.   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  Post treatment in [place].  Repeat injection today we will treat and extend OD next to 10 weeks         Intravitreal Injection, Pharmacologic Agent - OD - Right Eye       Time Out 08/03/2022. 10:26 AM. Confirmed correct patient, procedure, site, and patient consented.   Anesthesia Topical anesthesia was used. Anesthetic medications included Lidocaine 4%.   Procedure Preparation included 5% betadine to ocular surface, 10% betadine to eyelids, Tobramycin 0.3%, Ofloxacin . A 30 gauge needle was used.    Injection: 1.25 mg Bevacizumab 1.'25mg'$ /0.58m   Route: Intravitreal, Site: Right Eye   NDC: [id number] Lot: [id number] Expiration date: 10/13/2022   Post-op Post injection exam found visual acuity of at least counting fingers.  The patient tolerated the procedure well. There were no complications. The patient received written and verbal post procedure care education. Post injection medications included ocuflox.              ASSESSMENT/PLAN:  Intermediate stage nonexudative age-related macular degeneration of both eyes No sign of CNVM OS  Exudative age-related macular degeneration of right eye with active choroidal neovascularization (HCC) Repeat injection today as lesion stable at 8-week interval post Avastin and extend interval examination next to 10 weeks     ICD-10-CM   1. Exudative age-related macular degeneration of right eye with active choroidal neovascularization (HCC)  H35.3211 Intravitreal Injection, Pharmacologic Agent - OD - Right Eye    Bevacizumab (AVASTIN) SOLN 1.25 mg    2. Exudative age-related macular degeneration of right eye with inactive choroidal neovascularization (HCC)  H35.3212 OCT, Retina - OU - Both Eyes    3. Intermediate stage nonexudative age-related macular degeneration of both eyes  H35.3132       OD vastly improved and stable acuity currently at 8-week post injection for small lesion CNVM nasal to FAZ.  We will extend repeat interval today of injection Avastin and extend interval next to 10 weeks  2.  OS no sign of CNVM by exam  3.  Ophthalmic Meds Ordered this visit:  Meds ordered this encounter  Medications   Bevacizumab (AVASTIN) SOLN 1.25 mg       Return in about 10 weeks (around 10/12/2022) for dilate, OD, AVASTIN OCT.  Patient Instructions  Patient instructed to call at week 9 after injection today/to confirm appointment  Explained the diagnoses, plan, and follow up with the patient and they expressed understanding.  Patient  expressed understanding of the importance of proper follow up care.   Clent Demark Meaghan Whistler M.D. Diseases & Surgery of the Retina and Vitreous Retina [facility] Diabetic [place] 08/03/22     Abbreviations: M myopia (nearsighted); A astigmatism; H hyperopia (farsighted); P presbyopia; Mrx spectacle prescription;  CTL contact lenses; OD right eye; OS left eye; OU both eyes  XT exotropia; ET esotropia; PEK punctate epithelial keratitis; PEE punctate epithelial erosions; DES dry eye syndrome; MGD meibomian gland dysfunction; ATs artificial tears; PFAT's preservative free artificial tears; [place] nuclear sclerotic cataract; PSC posterior subcapsular cataract; ERM epi-retinal membrane; PVD posterior vitreous detachment; RD retinal detachment; DM diabetes mellitus; DR diabetic retinopathy; NPDR non-proliferative diabetic retinopathy; PDR proliferative diabetic retinopathy; CSME clinically significant macular edema; DME diabetic macular edema; dbh dot blot hemorrhages; CWS cotton wool spot; POAG primary open angle glaucoma; C/D cup-to-disc ratio; HVF humphrey visual field; GVF goldmann visual field; OCT optical coherence tomography; IOP intraocular pressure; BRVO Branch retinal vein occlusion; CRVO central retinal vein occlusion; CRAO central retinal artery occlusion; BRAO branch retinal artery occlusion; RT retinal tear; SB scleral buckle; PPV pars plana vitrectomy; VH Vitreous hemorrhage; PRP panretinal laser photocoagulation; IVK intravitreal kenalog; VMT vitreomacular traction; MH Macular hole;  NVD neovascularization of the disc; NVE neovascularization elsewhere; AREDS age related eye disease study; ARMD age related macular degeneration; POAG primary open angle glaucoma; EBMD epithelial/anterior basement membrane dystrophy; ACIOL anterior chamber intraocular lens; IOL intraocular lens; PCIOL posterior chamber intraocular lens; Phaco/IOL phacoemulsification with intraocular lens placement; [place] photorefractive keratectomy;  LASIK laser assisted in situ keratomileusis; HTN hypertension; DM diabetes mellitus; COPD chronic obstructive pulmonary disease

## 2022-08-03 NOTE — Patient Instructions (Signed)
Patient instructed to call at week 9 after injection today/to confirm appointment

## 2022-08-03 NOTE — Assessment & Plan Note (Signed)
No sign of CNVM OS 

## 2022-08-03 NOTE — Assessment & Plan Note (Signed)
Repeat injection today as lesion stable at 8-week interval post Avastin and extend interval examination next to 10 weeks

## 2022-08-16 DIAGNOSIS — H401122 Primary open-angle glaucoma, left eye, moderate stage: Secondary | ICD-10-CM | POA: Diagnosis not present

## 2022-08-16 DIAGNOSIS — H40021 Open angle with borderline findings, high risk, right eye: Secondary | ICD-10-CM | POA: Diagnosis not present

## 2022-08-16 DIAGNOSIS — H353121 Nonexudative age-related macular degeneration, left eye, early dry stage: Secondary | ICD-10-CM | POA: Diagnosis not present

## 2022-08-16 DIAGNOSIS — H353211 Exudative age-related macular degeneration, right eye, with active choroidal neovascularization: Secondary | ICD-10-CM | POA: Diagnosis not present

## 2022-08-16 DIAGNOSIS — H43811 Vitreous degeneration, right eye: Secondary | ICD-10-CM | POA: Diagnosis not present

## 2022-08-16 DIAGNOSIS — Z961 Presence of intraocular lens: Secondary | ICD-10-CM | POA: Diagnosis not present

## 2022-09-29 DIAGNOSIS — Z85828 Personal history of other malignant neoplasm of skin: Secondary | ICD-10-CM | POA: Diagnosis not present

## 2022-09-29 DIAGNOSIS — L570 Actinic keratosis: Secondary | ICD-10-CM | POA: Diagnosis not present

## 2022-09-29 DIAGNOSIS — L578 Other skin changes due to chronic exposure to nonionizing radiation: Secondary | ICD-10-CM | POA: Diagnosis not present

## 2022-09-29 DIAGNOSIS — L821 Other seborrheic keratosis: Secondary | ICD-10-CM | POA: Diagnosis not present

## 2022-10-13 ENCOUNTER — Encounter (INDEPENDENT_AMBULATORY_CARE_PROVIDER_SITE_OTHER): Payer: Medicare Other | Admitting: Ophthalmology

## 2022-10-18 DIAGNOSIS — H353211 Exudative age-related macular degeneration, right eye, with active choroidal neovascularization: Secondary | ICD-10-CM | POA: Diagnosis not present

## 2022-10-18 DIAGNOSIS — H353122 Nonexudative age-related macular degeneration, left eye, intermediate dry stage: Secondary | ICD-10-CM | POA: Diagnosis not present

## 2022-11-10 DIAGNOSIS — R972 Elevated prostate specific antigen [PSA]: Secondary | ICD-10-CM | POA: Diagnosis not present

## 2022-11-15 DIAGNOSIS — Z961 Presence of intraocular lens: Secondary | ICD-10-CM | POA: Diagnosis not present

## 2022-11-15 DIAGNOSIS — H40021 Open angle with borderline findings, high risk, right eye: Secondary | ICD-10-CM | POA: Diagnosis not present

## 2022-11-15 DIAGNOSIS — H353210 Exudative age-related macular degeneration, right eye, stage unspecified: Secondary | ICD-10-CM | POA: Diagnosis not present

## 2022-11-15 DIAGNOSIS — H353121 Nonexudative age-related macular degeneration, left eye, early dry stage: Secondary | ICD-10-CM | POA: Diagnosis not present

## 2022-11-15 DIAGNOSIS — H401122 Primary open-angle glaucoma, left eye, moderate stage: Secondary | ICD-10-CM | POA: Diagnosis not present

## 2022-11-15 DIAGNOSIS — H43811 Vitreous degeneration, right eye: Secondary | ICD-10-CM | POA: Diagnosis not present

## 2022-11-17 DIAGNOSIS — R972 Elevated prostate specific antigen [PSA]: Secondary | ICD-10-CM | POA: Diagnosis not present

## 2022-11-17 DIAGNOSIS — R3914 Feeling of incomplete bladder emptying: Secondary | ICD-10-CM | POA: Diagnosis not present

## 2022-11-17 DIAGNOSIS — N401 Enlarged prostate with lower urinary tract symptoms: Secondary | ICD-10-CM | POA: Diagnosis not present

## 2023-01-03 DIAGNOSIS — H353122 Nonexudative age-related macular degeneration, left eye, intermediate dry stage: Secondary | ICD-10-CM | POA: Diagnosis not present

## 2023-01-03 DIAGNOSIS — H43813 Vitreous degeneration, bilateral: Secondary | ICD-10-CM | POA: Diagnosis not present

## 2023-01-03 DIAGNOSIS — H353211 Exudative age-related macular degeneration, right eye, with active choroidal neovascularization: Secondary | ICD-10-CM | POA: Diagnosis not present

## 2023-01-03 DIAGNOSIS — H35033 Hypertensive retinopathy, bilateral: Secondary | ICD-10-CM | POA: Diagnosis not present

## 2023-01-03 DIAGNOSIS — H401131 Primary open-angle glaucoma, bilateral, mild stage: Secondary | ICD-10-CM | POA: Diagnosis not present

## 2023-03-01 DIAGNOSIS — H353121 Nonexudative age-related macular degeneration, left eye, early dry stage: Secondary | ICD-10-CM | POA: Diagnosis not present

## 2023-03-01 DIAGNOSIS — H43811 Vitreous degeneration, right eye: Secondary | ICD-10-CM | POA: Diagnosis not present

## 2023-03-01 DIAGNOSIS — H401123 Primary open-angle glaucoma, left eye, severe stage: Secondary | ICD-10-CM | POA: Diagnosis not present

## 2023-03-01 DIAGNOSIS — Z961 Presence of intraocular lens: Secondary | ICD-10-CM | POA: Diagnosis not present

## 2023-03-01 DIAGNOSIS — H353211 Exudative age-related macular degeneration, right eye, with active choroidal neovascularization: Secondary | ICD-10-CM | POA: Diagnosis not present

## 2023-03-01 DIAGNOSIS — H401112 Primary open-angle glaucoma, right eye, moderate stage: Secondary | ICD-10-CM | POA: Diagnosis not present

## 2023-03-26 DIAGNOSIS — H353122 Nonexudative age-related macular degeneration, left eye, intermediate dry stage: Secondary | ICD-10-CM | POA: Diagnosis not present

## 2023-03-26 DIAGNOSIS — H353211 Exudative age-related macular degeneration, right eye, with active choroidal neovascularization: Secondary | ICD-10-CM | POA: Diagnosis not present

## 2023-03-30 DIAGNOSIS — L821 Other seborrheic keratosis: Secondary | ICD-10-CM | POA: Diagnosis not present

## 2023-03-30 DIAGNOSIS — C44612 Basal cell carcinoma of skin of right upper limb, including shoulder: Secondary | ICD-10-CM | POA: Diagnosis not present

## 2023-03-30 DIAGNOSIS — L570 Actinic keratosis: Secondary | ICD-10-CM | POA: Diagnosis not present

## 2023-03-30 DIAGNOSIS — L578 Other skin changes due to chronic exposure to nonionizing radiation: Secondary | ICD-10-CM | POA: Diagnosis not present

## 2023-03-30 DIAGNOSIS — Z85828 Personal history of other malignant neoplasm of skin: Secondary | ICD-10-CM | POA: Diagnosis not present

## 2023-03-31 DIAGNOSIS — D485 Neoplasm of uncertain behavior of skin: Secondary | ICD-10-CM | POA: Diagnosis not present

## 2023-04-18 DIAGNOSIS — H43811 Vitreous degeneration, right eye: Secondary | ICD-10-CM | POA: Diagnosis not present

## 2023-04-18 DIAGNOSIS — H532 Diplopia: Secondary | ICD-10-CM | POA: Diagnosis not present

## 2023-04-18 DIAGNOSIS — Z961 Presence of intraocular lens: Secondary | ICD-10-CM | POA: Diagnosis not present

## 2023-04-18 DIAGNOSIS — H353121 Nonexudative age-related macular degeneration, left eye, early dry stage: Secondary | ICD-10-CM | POA: Diagnosis not present

## 2023-04-18 DIAGNOSIS — H353211 Exudative age-related macular degeneration, right eye, with active choroidal neovascularization: Secondary | ICD-10-CM | POA: Diagnosis not present

## 2023-04-18 DIAGNOSIS — H401123 Primary open-angle glaucoma, left eye, severe stage: Secondary | ICD-10-CM | POA: Diagnosis not present

## 2023-04-18 DIAGNOSIS — H401112 Primary open-angle glaucoma, right eye, moderate stage: Secondary | ICD-10-CM | POA: Diagnosis not present

## 2023-05-26 DIAGNOSIS — R5383 Other fatigue: Secondary | ICD-10-CM | POA: Diagnosis not present

## 2023-05-26 DIAGNOSIS — G459 Transient cerebral ischemic attack, unspecified: Secondary | ICD-10-CM | POA: Diagnosis not present

## 2023-05-26 DIAGNOSIS — Z7189 Other specified counseling: Secondary | ICD-10-CM | POA: Diagnosis not present

## 2023-05-26 DIAGNOSIS — Z8673 Personal history of transient ischemic attack (TIA), and cerebral infarction without residual deficits: Secondary | ICD-10-CM | POA: Diagnosis not present

## 2023-05-26 DIAGNOSIS — Z1339 Encounter for screening examination for other mental health and behavioral disorders: Secondary | ICD-10-CM | POA: Diagnosis not present

## 2023-05-26 DIAGNOSIS — Z1331 Encounter for screening for depression: Secondary | ICD-10-CM | POA: Diagnosis not present

## 2023-05-26 DIAGNOSIS — Z0000 Encounter for general adult medical examination without abnormal findings: Secondary | ICD-10-CM | POA: Diagnosis not present

## 2023-05-26 DIAGNOSIS — Z299 Encounter for prophylactic measures, unspecified: Secondary | ICD-10-CM | POA: Diagnosis not present

## 2023-05-26 DIAGNOSIS — E7800 Pure hypercholesterolemia, unspecified: Secondary | ICD-10-CM | POA: Diagnosis not present

## 2023-05-26 DIAGNOSIS — Z79899 Other long term (current) drug therapy: Secondary | ICD-10-CM | POA: Diagnosis not present

## 2023-06-21 DIAGNOSIS — H43813 Vitreous degeneration, bilateral: Secondary | ICD-10-CM | POA: Diagnosis not present

## 2023-06-21 DIAGNOSIS — H353211 Exudative age-related macular degeneration, right eye, with active choroidal neovascularization: Secondary | ICD-10-CM | POA: Diagnosis not present

## 2023-06-21 DIAGNOSIS — H40012 Open angle with borderline findings, low risk, left eye: Secondary | ICD-10-CM | POA: Diagnosis not present

## 2023-09-06 DIAGNOSIS — H532 Diplopia: Secondary | ICD-10-CM | POA: Diagnosis not present

## 2023-09-06 DIAGNOSIS — H401123 Primary open-angle glaucoma, left eye, severe stage: Secondary | ICD-10-CM | POA: Diagnosis not present

## 2023-09-06 DIAGNOSIS — H353211 Exudative age-related macular degeneration, right eye, with active choroidal neovascularization: Secondary | ICD-10-CM | POA: Diagnosis not present

## 2023-09-06 DIAGNOSIS — H43811 Vitreous degeneration, right eye: Secondary | ICD-10-CM | POA: Diagnosis not present

## 2023-09-06 DIAGNOSIS — H353121 Nonexudative age-related macular degeneration, left eye, early dry stage: Secondary | ICD-10-CM | POA: Diagnosis not present

## 2023-09-06 DIAGNOSIS — H401112 Primary open-angle glaucoma, right eye, moderate stage: Secondary | ICD-10-CM | POA: Diagnosis not present

## 2023-09-06 DIAGNOSIS — H26493 Other secondary cataract, bilateral: Secondary | ICD-10-CM | POA: Diagnosis not present

## 2023-09-06 DIAGNOSIS — Z961 Presence of intraocular lens: Secondary | ICD-10-CM | POA: Diagnosis not present

## 2023-09-26 DIAGNOSIS — H43813 Vitreous degeneration, bilateral: Secondary | ICD-10-CM | POA: Diagnosis not present

## 2023-09-26 DIAGNOSIS — H353122 Nonexudative age-related macular degeneration, left eye, intermediate dry stage: Secondary | ICD-10-CM | POA: Diagnosis not present

## 2023-09-26 DIAGNOSIS — H353211 Exudative age-related macular degeneration, right eye, with active choroidal neovascularization: Secondary | ICD-10-CM | POA: Diagnosis not present

## 2023-09-26 DIAGNOSIS — H35033 Hypertensive retinopathy, bilateral: Secondary | ICD-10-CM | POA: Diagnosis not present

## 2023-09-26 DIAGNOSIS — H401131 Primary open-angle glaucoma, bilateral, mild stage: Secondary | ICD-10-CM | POA: Diagnosis not present

## 2023-09-28 DIAGNOSIS — C44519 Basal cell carcinoma of skin of other part of trunk: Secondary | ICD-10-CM | POA: Diagnosis not present

## 2023-09-28 DIAGNOSIS — L820 Inflamed seborrheic keratosis: Secondary | ICD-10-CM | POA: Diagnosis not present

## 2023-09-28 DIAGNOSIS — L570 Actinic keratosis: Secondary | ICD-10-CM | POA: Diagnosis not present

## 2023-09-28 DIAGNOSIS — Z85828 Personal history of other malignant neoplasm of skin: Secondary | ICD-10-CM | POA: Diagnosis not present

## 2023-09-28 DIAGNOSIS — Z0289 Encounter for other administrative examinations: Secondary | ICD-10-CM | POA: Diagnosis not present

## 2023-09-28 DIAGNOSIS — L578 Other skin changes due to chronic exposure to nonionizing radiation: Secondary | ICD-10-CM | POA: Diagnosis not present

## 2023-09-28 DIAGNOSIS — L821 Other seborrheic keratosis: Secondary | ICD-10-CM | POA: Diagnosis not present

## 2023-09-30 DIAGNOSIS — D485 Neoplasm of uncertain behavior of skin: Secondary | ICD-10-CM | POA: Diagnosis not present

## 2023-11-08 DIAGNOSIS — H26492 Other secondary cataract, left eye: Secondary | ICD-10-CM | POA: Diagnosis not present

## 2023-11-11 DIAGNOSIS — R972 Elevated prostate specific antigen [PSA]: Secondary | ICD-10-CM | POA: Diagnosis not present

## 2023-11-18 DIAGNOSIS — N401 Enlarged prostate with lower urinary tract symptoms: Secondary | ICD-10-CM | POA: Diagnosis not present

## 2023-11-18 DIAGNOSIS — R972 Elevated prostate specific antigen [PSA]: Secondary | ICD-10-CM | POA: Diagnosis not present

## 2023-11-18 DIAGNOSIS — R3914 Feeling of incomplete bladder emptying: Secondary | ICD-10-CM | POA: Diagnosis not present

## 2023-12-26 DIAGNOSIS — H353122 Nonexudative age-related macular degeneration, left eye, intermediate dry stage: Secondary | ICD-10-CM | POA: Diagnosis not present

## 2023-12-26 DIAGNOSIS — H353211 Exudative age-related macular degeneration, right eye, with active choroidal neovascularization: Secondary | ICD-10-CM | POA: Diagnosis not present

## 2024-03-28 DIAGNOSIS — L578 Other skin changes due to chronic exposure to nonionizing radiation: Secondary | ICD-10-CM | POA: Diagnosis not present

## 2024-03-28 DIAGNOSIS — L821 Other seborrheic keratosis: Secondary | ICD-10-CM | POA: Diagnosis not present

## 2024-03-28 DIAGNOSIS — L570 Actinic keratosis: Secondary | ICD-10-CM | POA: Diagnosis not present

## 2024-03-28 DIAGNOSIS — D045 Carcinoma in situ of skin of trunk: Secondary | ICD-10-CM | POA: Diagnosis not present

## 2024-03-28 DIAGNOSIS — Z85828 Personal history of other malignant neoplasm of skin: Secondary | ICD-10-CM | POA: Diagnosis not present

## 2024-03-28 DIAGNOSIS — L820 Inflamed seborrheic keratosis: Secondary | ICD-10-CM | POA: Diagnosis not present

## 2024-03-30 DIAGNOSIS — D485 Neoplasm of uncertain behavior of skin: Secondary | ICD-10-CM | POA: Diagnosis not present

## 2024-04-02 DIAGNOSIS — H353122 Nonexudative age-related macular degeneration, left eye, intermediate dry stage: Secondary | ICD-10-CM | POA: Diagnosis not present

## 2024-04-02 DIAGNOSIS — H353211 Exudative age-related macular degeneration, right eye, with active choroidal neovascularization: Secondary | ICD-10-CM | POA: Diagnosis not present

## 2024-04-16 DIAGNOSIS — Z961 Presence of intraocular lens: Secondary | ICD-10-CM | POA: Diagnosis not present

## 2024-04-16 DIAGNOSIS — H43811 Vitreous degeneration, right eye: Secondary | ICD-10-CM | POA: Diagnosis not present

## 2024-04-16 DIAGNOSIS — H532 Diplopia: Secondary | ICD-10-CM | POA: Diagnosis not present

## 2024-04-16 DIAGNOSIS — H401123 Primary open-angle glaucoma, left eye, severe stage: Secondary | ICD-10-CM | POA: Diagnosis not present

## 2024-04-16 DIAGNOSIS — H26491 Other secondary cataract, right eye: Secondary | ICD-10-CM | POA: Diagnosis not present

## 2024-04-16 DIAGNOSIS — H401112 Primary open-angle glaucoma, right eye, moderate stage: Secondary | ICD-10-CM | POA: Diagnosis not present

## 2024-05-01 DIAGNOSIS — H401112 Primary open-angle glaucoma, right eye, moderate stage: Secondary | ICD-10-CM | POA: Diagnosis not present

## 2024-05-01 DIAGNOSIS — H401123 Primary open-angle glaucoma, left eye, severe stage: Secondary | ICD-10-CM | POA: Diagnosis not present

## 2024-05-01 DIAGNOSIS — Z961 Presence of intraocular lens: Secondary | ICD-10-CM | POA: Diagnosis not present

## 2024-05-01 DIAGNOSIS — H26491 Other secondary cataract, right eye: Secondary | ICD-10-CM | POA: Diagnosis not present

## 2024-05-01 DIAGNOSIS — H43811 Vitreous degeneration, right eye: Secondary | ICD-10-CM | POA: Diagnosis not present

## 2024-05-01 DIAGNOSIS — H532 Diplopia: Secondary | ICD-10-CM | POA: Diagnosis not present

## 2024-05-28 DIAGNOSIS — Z1339 Encounter for screening examination for other mental health and behavioral disorders: Secondary | ICD-10-CM | POA: Diagnosis not present

## 2024-05-28 DIAGNOSIS — E7800 Pure hypercholesterolemia, unspecified: Secondary | ICD-10-CM | POA: Diagnosis not present

## 2024-05-28 DIAGNOSIS — Z299 Encounter for prophylactic measures, unspecified: Secondary | ICD-10-CM | POA: Diagnosis not present

## 2024-05-28 DIAGNOSIS — R5383 Other fatigue: Secondary | ICD-10-CM | POA: Diagnosis not present

## 2024-05-28 DIAGNOSIS — Z7189 Other specified counseling: Secondary | ICD-10-CM | POA: Diagnosis not present

## 2024-05-28 DIAGNOSIS — Z1331 Encounter for screening for depression: Secondary | ICD-10-CM | POA: Diagnosis not present

## 2024-05-28 DIAGNOSIS — Z79899 Other long term (current) drug therapy: Secondary | ICD-10-CM | POA: Diagnosis not present

## 2024-05-28 DIAGNOSIS — Z0000 Encounter for general adult medical examination without abnormal findings: Secondary | ICD-10-CM | POA: Diagnosis not present

## 2024-07-02 DIAGNOSIS — H353132 Nonexudative age-related macular degeneration, bilateral, intermediate dry stage: Secondary | ICD-10-CM | POA: Diagnosis not present

## 2024-07-02 DIAGNOSIS — H43813 Vitreous degeneration, bilateral: Secondary | ICD-10-CM | POA: Diagnosis not present

## 2024-07-02 DIAGNOSIS — H353211 Exudative age-related macular degeneration, right eye, with active choroidal neovascularization: Secondary | ICD-10-CM | POA: Diagnosis not present

## 2024-07-02 DIAGNOSIS — H401132 Primary open-angle glaucoma, bilateral, moderate stage: Secondary | ICD-10-CM | POA: Diagnosis not present

## 2024-08-08 DIAGNOSIS — H401132 Primary open-angle glaucoma, bilateral, moderate stage: Secondary | ICD-10-CM | POA: Diagnosis not present

## 2024-08-08 DIAGNOSIS — H43813 Vitreous degeneration, bilateral: Secondary | ICD-10-CM | POA: Diagnosis not present

## 2024-08-08 DIAGNOSIS — H353132 Nonexudative age-related macular degeneration, bilateral, intermediate dry stage: Secondary | ICD-10-CM | POA: Diagnosis not present

## 2024-08-08 DIAGNOSIS — H353211 Exudative age-related macular degeneration, right eye, with active choroidal neovascularization: Secondary | ICD-10-CM | POA: Diagnosis not present

## 2024-09-04 DIAGNOSIS — H401112 Primary open-angle glaucoma, right eye, moderate stage: Secondary | ICD-10-CM | POA: Diagnosis not present

## 2024-09-04 DIAGNOSIS — H532 Diplopia: Secondary | ICD-10-CM | POA: Diagnosis not present

## 2024-09-04 DIAGNOSIS — H43811 Vitreous degeneration, right eye: Secondary | ICD-10-CM | POA: Diagnosis not present

## 2024-09-04 DIAGNOSIS — H401123 Primary open-angle glaucoma, left eye, severe stage: Secondary | ICD-10-CM | POA: Diagnosis not present

## 2024-09-04 DIAGNOSIS — H26491 Other secondary cataract, right eye: Secondary | ICD-10-CM | POA: Diagnosis not present

## 2024-09-12 DIAGNOSIS — H43813 Vitreous degeneration, bilateral: Secondary | ICD-10-CM | POA: Diagnosis not present

## 2024-09-12 DIAGNOSIS — H401132 Primary open-angle glaucoma, bilateral, moderate stage: Secondary | ICD-10-CM | POA: Diagnosis not present

## 2024-09-12 DIAGNOSIS — H353211 Exudative age-related macular degeneration, right eye, with active choroidal neovascularization: Secondary | ICD-10-CM | POA: Diagnosis not present

## 2024-09-12 DIAGNOSIS — H353132 Nonexudative age-related macular degeneration, bilateral, intermediate dry stage: Secondary | ICD-10-CM | POA: Diagnosis not present

## 2024-09-26 DIAGNOSIS — L570 Actinic keratosis: Secondary | ICD-10-CM | POA: Diagnosis not present

## 2024-09-26 DIAGNOSIS — D045 Carcinoma in situ of skin of trunk: Secondary | ICD-10-CM | POA: Diagnosis not present

## 2024-09-26 DIAGNOSIS — D044 Carcinoma in situ of skin of scalp and neck: Secondary | ICD-10-CM | POA: Diagnosis not present

## 2024-09-26 DIAGNOSIS — L578 Other skin changes due to chronic exposure to nonionizing radiation: Secondary | ICD-10-CM | POA: Diagnosis not present

## 2024-09-26 DIAGNOSIS — L821 Other seborrheic keratosis: Secondary | ICD-10-CM | POA: Diagnosis not present

## 2024-09-26 DIAGNOSIS — Z85828 Personal history of other malignant neoplasm of skin: Secondary | ICD-10-CM | POA: Diagnosis not present

## 2024-10-23 DIAGNOSIS — H353132 Nonexudative age-related macular degeneration, bilateral, intermediate dry stage: Secondary | ICD-10-CM | POA: Diagnosis not present

## 2024-10-23 DIAGNOSIS — H353211 Exudative age-related macular degeneration, right eye, with active choroidal neovascularization: Secondary | ICD-10-CM | POA: Diagnosis not present

## 2024-10-23 DIAGNOSIS — H43813 Vitreous degeneration, bilateral: Secondary | ICD-10-CM | POA: Diagnosis not present

## 2024-10-23 DIAGNOSIS — H401132 Primary open-angle glaucoma, bilateral, moderate stage: Secondary | ICD-10-CM | POA: Diagnosis not present

## 2024-11-07 DIAGNOSIS — R972 Elevated prostate specific antigen [PSA]: Secondary | ICD-10-CM | POA: Diagnosis not present
